# Patient Record
Sex: Male | Born: 1970 | Race: White | Hispanic: No | Marital: Married | Smoking: Former smoker
Health system: Southern US, Community
[De-identification: ages and names within clinical notes are randomized; demographics above are authoritative.]

## PROBLEM LIST (undated history)

## (undated) DIAGNOSIS — E7800 Pure hypercholesterolemia, unspecified: Secondary | ICD-10-CM

## (undated) DIAGNOSIS — K429 Umbilical hernia without obstruction or gangrene: Secondary | ICD-10-CM

## (undated) DIAGNOSIS — D171 Benign lipomatous neoplasm of skin and subcutaneous tissue of trunk: Secondary | ICD-10-CM

## (undated) DIAGNOSIS — R7989 Other specified abnormal findings of blood chemistry: Secondary | ICD-10-CM

## (undated) DIAGNOSIS — Z9889 Other specified postprocedural states: Secondary | ICD-10-CM

## (undated) DIAGNOSIS — K219 Gastro-esophageal reflux disease without esophagitis: Secondary | ICD-10-CM

## (undated) DIAGNOSIS — R112 Nausea with vomiting, unspecified: Secondary | ICD-10-CM

## (undated) DIAGNOSIS — G4730 Sleep apnea, unspecified: Secondary | ICD-10-CM

## (undated) DIAGNOSIS — I10 Essential (primary) hypertension: Secondary | ICD-10-CM

## (undated) HISTORY — PX: LIPOMA EXCISION: SHX5283

---

## 2006-07-21 ENCOUNTER — Ambulatory Visit: Payer: Self-pay | Admitting: Family Medicine

## 2007-05-10 ENCOUNTER — Emergency Department: Payer: Self-pay | Admitting: Emergency Medicine

## 2007-07-25 ENCOUNTER — Ambulatory Visit: Payer: Self-pay | Admitting: Family Medicine

## 2008-05-28 ENCOUNTER — Ambulatory Visit: Payer: Self-pay | Admitting: Internal Medicine

## 2008-07-01 ENCOUNTER — Ambulatory Visit: Payer: Self-pay | Admitting: Family Medicine

## 2010-05-04 ENCOUNTER — Ambulatory Visit: Payer: Self-pay | Admitting: Surgery

## 2010-05-27 ENCOUNTER — Ambulatory Visit: Payer: Self-pay | Admitting: Surgery

## 2010-06-18 ENCOUNTER — Ambulatory Visit: Payer: Self-pay | Admitting: Surgery

## 2011-02-10 DIAGNOSIS — I10 Essential (primary) hypertension: Secondary | ICD-10-CM | POA: Insufficient documentation

## 2011-02-10 HISTORY — DX: Essential (primary) hypertension: I10

## 2011-03-14 DIAGNOSIS — J309 Allergic rhinitis, unspecified: Secondary | ICD-10-CM | POA: Insufficient documentation

## 2011-03-14 HISTORY — DX: Allergic rhinitis, unspecified: J30.9

## 2011-12-09 DIAGNOSIS — M549 Dorsalgia, unspecified: Secondary | ICD-10-CM | POA: Insufficient documentation

## 2011-12-09 HISTORY — DX: Dorsalgia, unspecified: M54.9

## 2012-03-10 ENCOUNTER — Ambulatory Visit: Payer: Self-pay | Admitting: Medical

## 2012-03-16 ENCOUNTER — Ambulatory Visit: Payer: Self-pay | Admitting: Medical

## 2012-10-30 ENCOUNTER — Ambulatory Visit: Payer: Self-pay | Admitting: Family Medicine

## 2014-09-10 IMAGING — CT CT STONE STUDY
1 of 2 series · 15 of 32 positions shown, 19 images · non-contrast
Comparison: none

REASON FOR EXAM: flank pain  eval for kidney stones
COMMENTS:

[Series 2: soft tissue · axial · 0.78mm/px · z∈[-812,-350]mm · 15 of 169 slices shown, 19 images]
[im 8/169  soft-tissue]
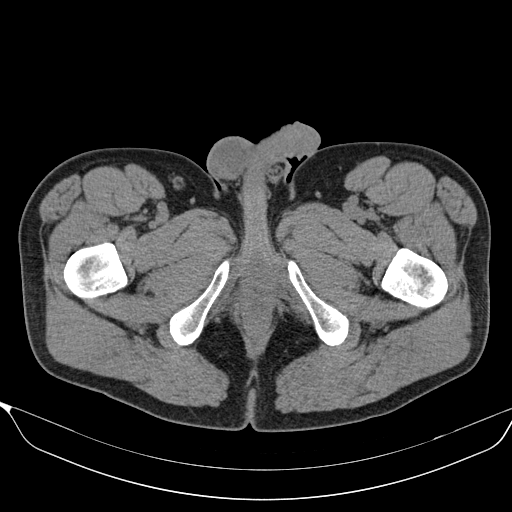
[im 8/169  bone]
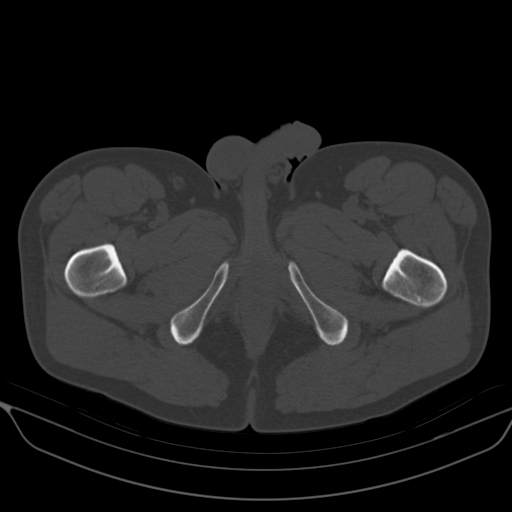
[im 22/169  soft-tissue]
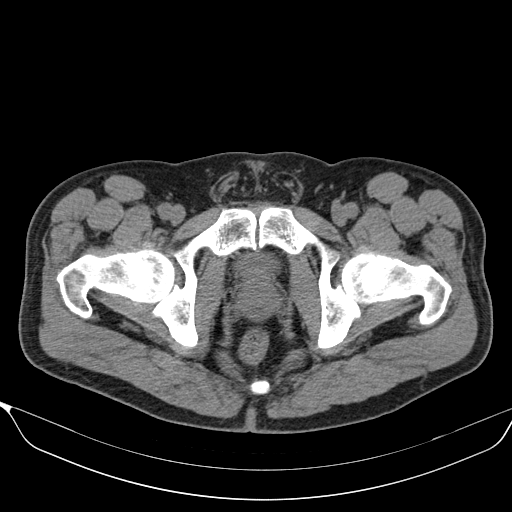
[im 36/169  soft-tissue]
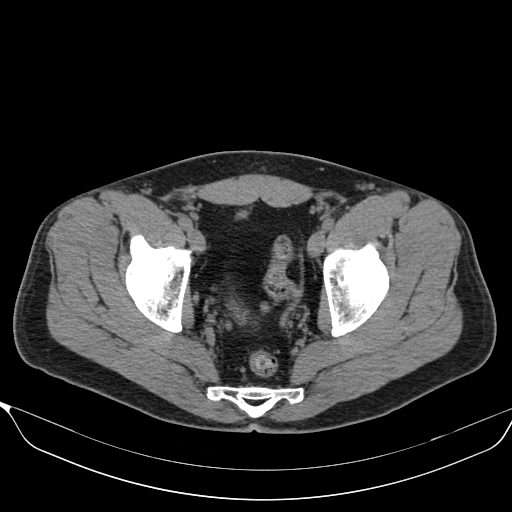
[im 50/169  soft-tissue]
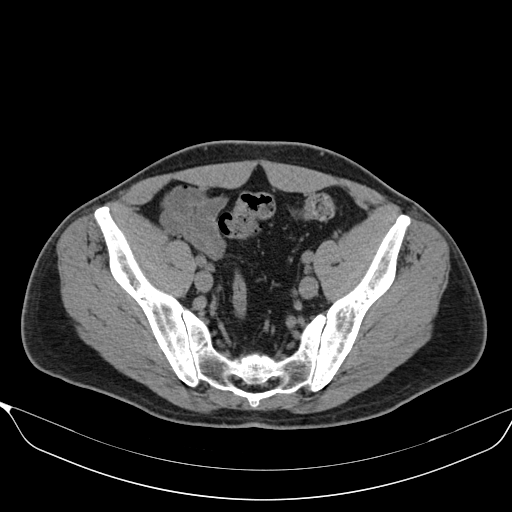
[im 57/169  soft-tissue]
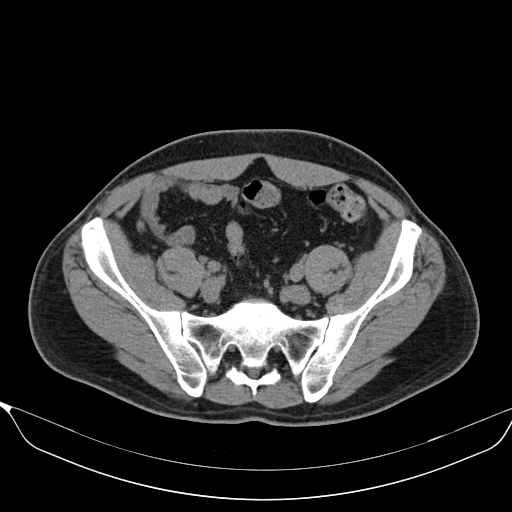
[im 71/169  soft-tissue]
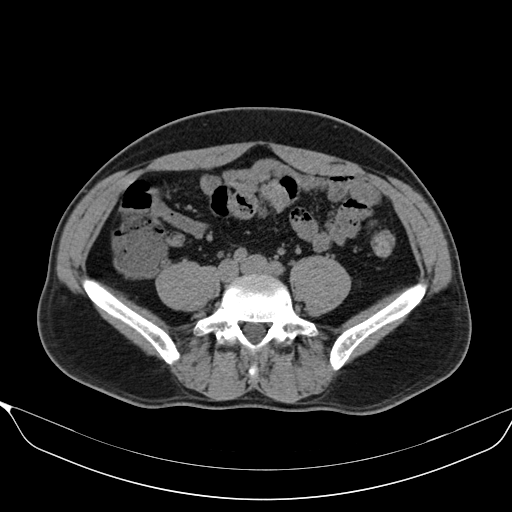
[im 85/169  soft-tissue]
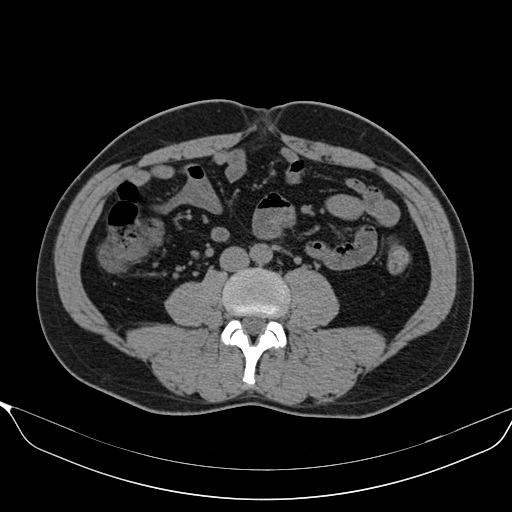
[im 99/169  soft-tissue]
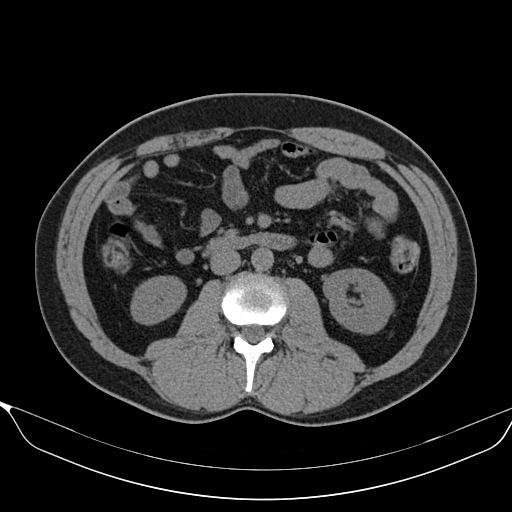
[im 113/169  soft-tissue]
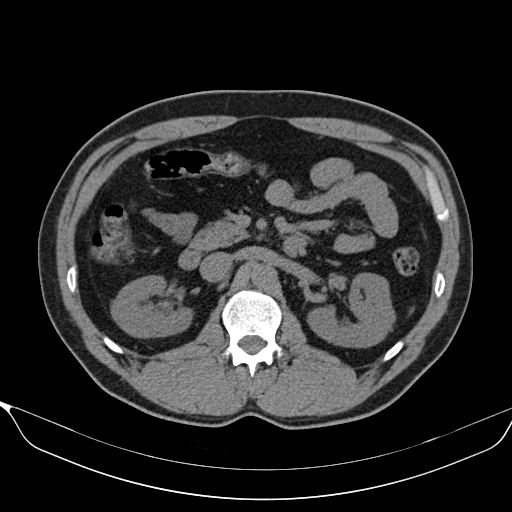
[im 113/169  bone]
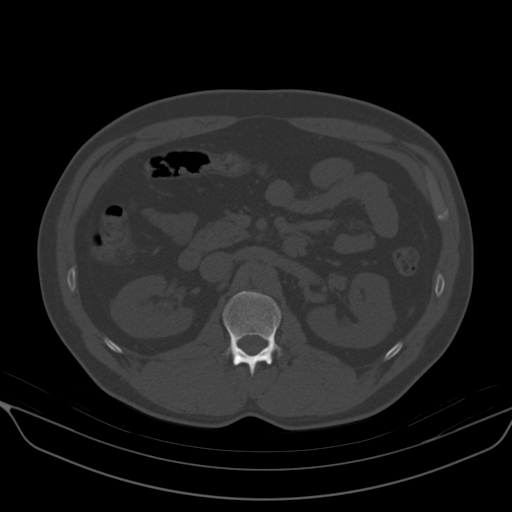
[im 120/169  soft-tissue]
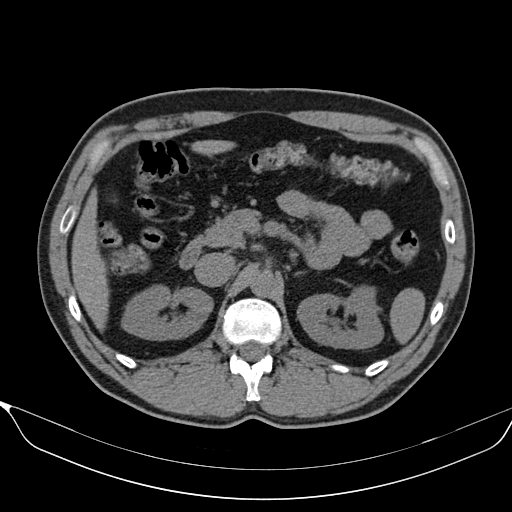
[im 134/169  soft-tissue]
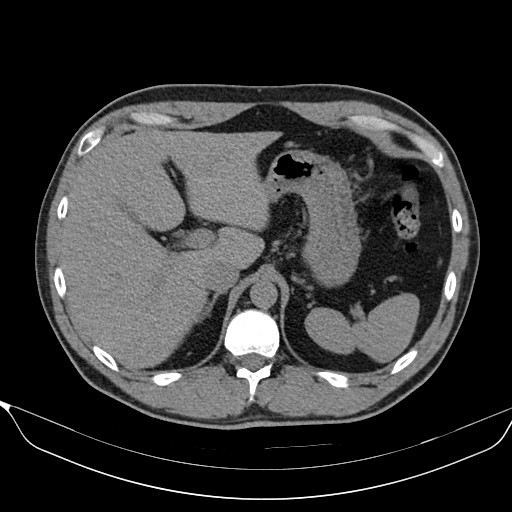
[im 141/169  lung]
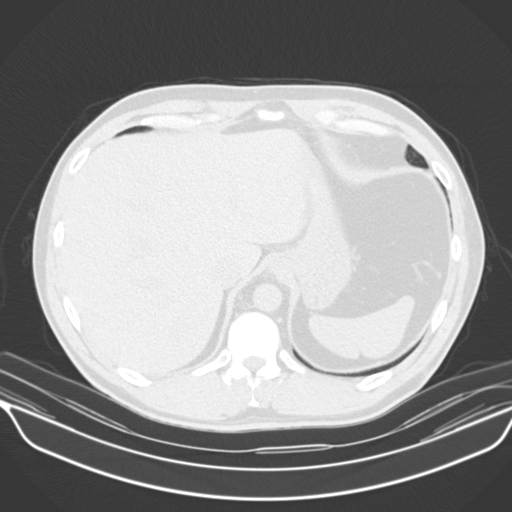
[im 148/169  soft-tissue]
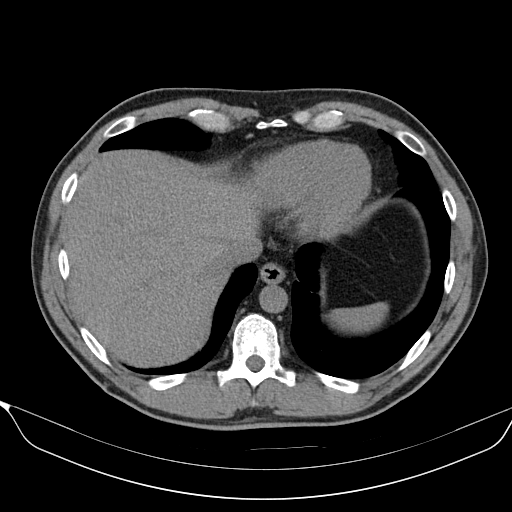
[im 148/169  lung]
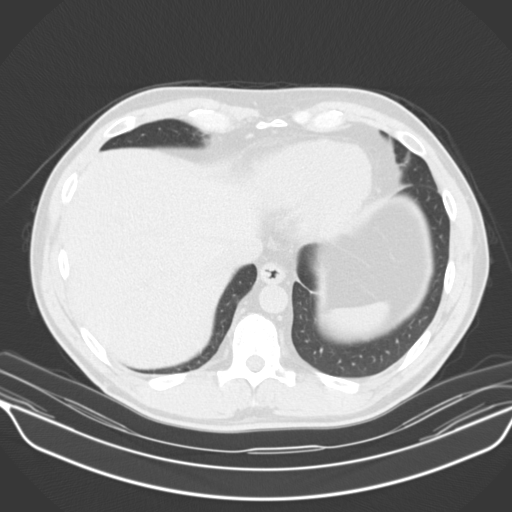
[im 155/169  lung]
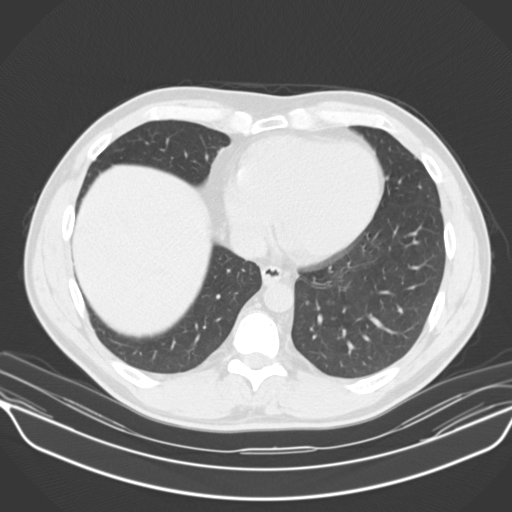
[im 162/169  soft-tissue]
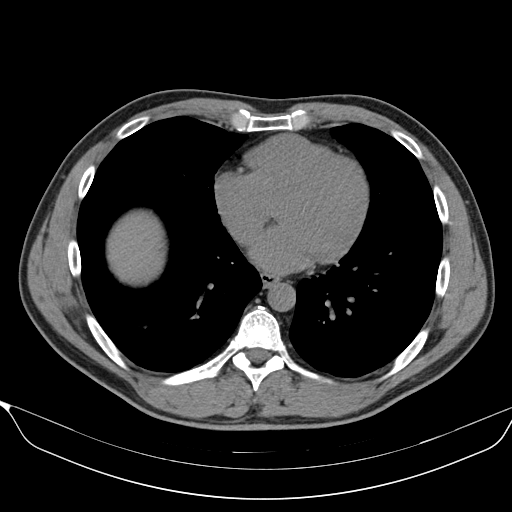
[im 162/169  lung]
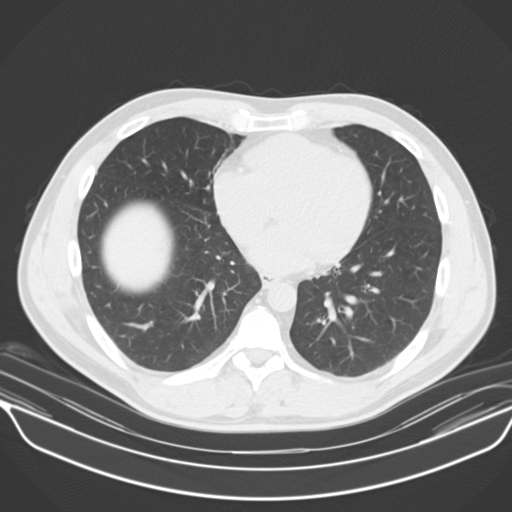

[15 of 32 positions shown; findings below may reference images not displayed]

PROCEDURE:     SHUMAILA - SHUMAILA ABDOMEN/PELVIS WO ( STONE)  - October 30, 2012 [DATE]

RESULT:     Noncontrast CT of the abdomen and pelvis is reconstructed at 3
mm slice thickness in the axial plane. The patient has no previous exam for
comparison.

There is no evidence of nephrolithiasis or hydronephrosis. No hydroureter is
evident. No ureteral stones are evident. No bladder calculi are
demonstrated. The appendix is seen and appears normal. The aorta is normal
in caliber. There is no abnormal bowel distention or wall thickening
evident. The noncontrast images through the base the lungs then straight
minimal nodularity laterally in the left lower lobe on image 5 measuring 2
mm. The this is noncalcified. No other pulmonary nodules are seen. There is
no pleural or pericardial effusion. The heart is normal in size. The
noncontrast images show grossly normal appearance of the liver, spleen,
pancreas, adrenal glands and bowel. No adenopathy is evident. The bony
structures appear within normal limits. There is no evidence of a renal mass.
IMPRESSION: 1. No nephrolithiasis, hydronephrosis or renal mass evident. No radiopaque
gallstones are evident. The abdominal viscera otherwise appear grossly
normal for noncontrast exam. The appendix is seen and appears unremarkable.
The abdominal wall is intact.

[REDACTED]

## 2014-09-18 ENCOUNTER — Ambulatory Visit: Payer: Self-pay | Admitting: Emergency Medicine

## 2015-07-09 ENCOUNTER — Ambulatory Visit
Admission: EM | Admit: 2015-07-09 | Discharge: 2015-07-09 | Disposition: A | Discharge status: Home / Self Care | Payer: Managed Care, Other (non HMO) | Attending: Family Medicine | Admitting: Family Medicine

## 2015-07-09 DIAGNOSIS — H6981 Other specified disorders of Eustachian tube, right ear: Secondary | ICD-10-CM

## 2015-07-09 DIAGNOSIS — J0100 Acute maxillary sinusitis, unspecified: Secondary | ICD-10-CM

## 2015-07-09 HISTORY — DX: Essential (primary) hypertension: I10

## 2015-07-09 HISTORY — DX: Pure hypercholesterolemia, unspecified: E78.00

## 2015-07-09 MED ORDER — AMOXICILLIN-POT CLAVULANATE 875-125 MG PO TABS: 1.0000 | ORAL_TABLET | Freq: Two times a day (BID) | ORAL | Status: DC

## 2015-07-09 MED ORDER — LORATADINE-PSEUDOEPHEDRINE ER 5-120 MG PO TB12: 1.0000 | ORAL_TABLET | Freq: Two times a day (BID) | ORAL | Status: AC

## 2015-07-09 NOTE — ED Provider Notes (Signed)
[facility]  ____________________________________________  Time seen: Approximately 10:38 AM  I have reviewed the triage vital signs and the nursing notes.   HISTORY  Chief Complaint URI and Otalgia   HPI Jay Trujillo is a 44 y.o. male presents for complaint of 7 days of runny nose, nasal congestion, sinus drainage and right ear discomfort. Patient reports in the last 3 days right ear discomfort has increased. Patient has right ear discomfort as 5 out of 10 aching and throbbing and states feels clogged. Denies hearing changes. Denies fall or trauma. Denies drainage from ear.  Patient does report intermittent thick greenish nasal drainage. Reports intermittent cough but states that that has a nonproductive dry cough. Reports continues to eat and drink well. Denies fevers. Reports several of his children recently with similar.  Denies chest pain, shortness breath, abdominal pain, dizziness, neck or back pain. Denies recent sickness. Denies recent antibiotic use. Patient reports he has not yet taken his daily hypertensive medication yet, but will take once returning home.    Past Medical History  Diagnosis Date  . Hypertension   . Hypercholesteremia     There are no active problems to display for this patient.   History reviewed. No pertinent past surgical history.  Current Outpatient Rx  Name  Route  Sig  Dispense  Refill  . lisinopril (PRINIVIL,ZESTRIL) 20 MG tablet   Oral   Take 20 mg by mouth daily.         . rosuvastatin (CRESTOR) 20 MG tablet   Oral   Take 20 mg by mouth daily.           Allergies Review of patient's allergies indicates no known allergies.  Family History  Problem Relation Age of Onset  . Diabetes Mother     Social History Social History  Substance Use Topics  . Smoking status: Former Research scientist (life sciences)  . Smokeless tobacco: None  . Alcohol Use: Yes     Comment: socially   Family history Father: CAD, MI   Review of  Systems Constitutional: No fever/chills Eyes: No visual changes. ENT: No sore throat.positive for runny nose, nasal congestion, right ear discomfort.  Cardiovascular: Denies chest pain. Respiratory: Denies shortness of breath. Gastrointestinal: No abdominal pain.  No nausea, no vomiting.  No diarrhea.  No constipation. Genitourinary: Negative for dysuria. Musculoskeletal: Negative for back pain. Skin: Negative for rash. Neurological: Negative for headaches, focal weakness or numbness.  10-point ROS otherwise negative.  ____________________________________________   PHYSICAL EXAM:  VITAL SIGNS: ED Triage Vitals  Enc Vitals Group     BP 07/09/15 1026 135/89 mmHg     Pulse Rate 07/09/15 1026 83     Resp 07/09/15 1026 17     Temp 07/09/15 1026 97.3 F (36.3 C)     Temp Source 07/09/15 1026 Tympanic     SpO2 07/09/15 1026 98 %     Weight 07/09/15 1026 198 lb (89.812 kg)     Height 07/09/15 1026 6' (1.829 m)     Head Cir --      Peak Flow --      Pain Score 07/09/15 1030 3     Pain Loc --      Pain Edu? --      Excl. in [place]? --     Constitutional: Alert and oriented. Well appearing and in no acute distress. Eyes: Conjunctivae are normal. PERRL. EOMI. Head: Atraumatic. Mild to moderate tenderness to palpation bilateral maxillary as well as frontal sinus tenderness to  palpation, no swelling. No erythema.   Ears: no erythema, normal TMs bilaterally.   Nose: Nasal congestion. Bilateral nasal turbinate erythema and edema.   Mouth/Throat: Mucous membranes are moist.  Mild pharyngeal erythema. No tonsillar swelling or exudate. No uvular shift or deviation. Neck: No stridor.  No cervical spine tenderness to palpation. Hematological/Lymphatic/Immunilogical: No cervical lymphadenopathy. Cardiovascular: Normal rate, regular rhythm. Grossly normal heart sounds.  Good peripheral circulation. Respiratory: Normal respiratory effort.  No retractions. Lungs CTAB. No wheeze, rales or rhonchi.  Good air movement. Gastrointestinal: Soft and nontender.  Musculoskeletal: No lower or upper extremity tenderness nor edema.  Bilateral pedal pulses equal and easily palpated.  Neurologic:  Normal speech and language. No gross focal neurologic deficits are appreciated. No gait instability. Skin:  Skin is warm, dry and intact. No rash noted. Psychiatric: Mood and affect are normal. Speech and behavior are normal. ____________________________________________   LABS (all labs ordered are listed, but only abnormal results are displayed)  Labs Reviewed - No data to display   INITIAL IMPRESSION / ASSESSMENT AND PLAN / ED COURSE  Pertinent labs & imaging results that were available during my care of the patient were reviewed by me and considered in my medical decision making (see chart for details).  Very well-appearing patient. No acute distress. Presents for complaints of 1 week in the upper respiratory infection symptoms as well as sinus drainage and right ear discomfort. Reports continues to eat and drink well. Afebrile. Vital signs stable. Reports his complaints of pain unrelieved with over-the-counter Mucinex. Lungs clear throughout. Abdomen soft and nontender. Moist membranes. Very well-appearing patient. Suspect frontal and maxillary sinusitis as well as right eustachian tube dysfunction. Will treat patient with oral Augmentin as well as Claritin-D. Encouraged patient to rest, fluids, over-the-counter Tylenol ibuprofen as needed.  Follow up with Primary care physician this week. Discussed follow up and return parameters including no resolution or any worsening concerns. Patient verbalized understanding and agreed to plan.   ____________________________________________   FINAL CLINICAL IMPRESSION(S) / ED DIAGNOSES  Final diagnoses:  None       Marylene Land, NP 07/09/15 1246

## 2015-07-09 NOTE — ED Notes (Signed)
States started 1 week ago with pain below right ear. For the last 3 days had cough and worsening ear and jaw pain

## 2015-07-09 NOTE — Discharge Instructions (Signed)
Take medication as prescribed. Rest. Drink plenty of fluids.   Follow up with your primary care physician this week as need. Return to Urgent care as needed for new or worsening concerns.   Sinusitis, Adult Sinusitis is redness, soreness, and inflammation of the paranasal sinuses. Paranasal sinuses are air pockets within the bones of your face. They are located beneath your eyes, in the middle of your forehead, and above your eyes. In healthy paranasal sinuses, mucus is able to drain out, and air is able to circulate through them by way of your nose. However, when your paranasal sinuses are inflamed, mucus and air can become trapped. This can allow bacteria and other germs to grow and cause infection. Sinusitis can develop quickly and last only a short time (acute) or continue over a long period (chronic). Sinusitis that lasts for more than 12 weeks is considered chronic. CAUSES Causes of sinusitis include:  Allergies.  Structural abnormalities, such as displacement of the cartilage that separates your nostrils (deviated septum), which can decrease the air flow through your nose and sinuses and affect sinus drainage.  Functional abnormalities, such as when the small hairs (cilia) that line your sinuses and help remove mucus do not work properly or are not present. SIGNS AND SYMPTOMS Symptoms of acute and chronic sinusitis are the same. The primary symptoms are pain and pressure around the affected sinuses. Other symptoms include:  Upper toothache.  Earache.  Headache.  Bad breath.  Decreased sense of smell and taste.  A cough, which worsens when you are lying flat.  Fatigue.  Fever.  Thick drainage from your nose, which often is green and may contain pus (purulent).  Swelling and warmth over the affected sinuses. DIAGNOSIS Your health care provider will perform a physical exam. During your exam, your health care provider may perform any of the following to help determine if you  have acute sinusitis or chronic sinusitis:  Look in your nose for signs of abnormal growths in your nostrils (nasal polyps).  Tap over the affected sinus to check for signs of infection.  View the inside of your sinuses using an imaging device that has a light attached (endoscope). If your health care provider suspects that you have chronic sinusitis, one or more of the following tests may be recommended:  Allergy tests.  Nasal culture. A sample of mucus is taken from your nose, sent to a lab, and screened for bacteria.  Nasal cytology. A sample of mucus is taken from your nose and examined by your health care provider to determine if your sinusitis is related to an allergy. TREATMENT Most cases of acute sinusitis are related to a viral infection and will resolve on their own within 10 days. Sometimes, medicines are prescribed to help relieve symptoms of both acute and chronic sinusitis. These may include pain medicines, decongestants, nasal steroid sprays, or saline sprays. However, for sinusitis related to a bacterial infection, your health care provider will prescribe antibiotic medicines. These are medicines that will help kill the bacteria causing the infection. Rarely, sinusitis is caused by a fungal infection. In these cases, your health care provider will prescribe antifungal medicine. For some cases of chronic sinusitis, surgery is needed. Generally, these are cases in which sinusitis recurs more than 3 times per year, despite other treatments. HOME CARE INSTRUCTIONS  Drink plenty of water. Water helps thin the mucus so your sinuses can drain more easily.  Use a humidifier.  Inhale steam 3-4 times a day (for example, sit  in the bathroom with the shower running).  Apply a warm, moist washcloth to your face 3-4 times a day, or as directed by your health care provider.  Use saline nasal sprays to help moisten and clean your sinuses.  Take medicines only as directed by your health  care provider.  If you were prescribed either an antibiotic or antifungal medicine, finish it all even if you start to feel better. SEEK IMMEDIATE MEDICAL CARE IF:  You have increasing pain or severe headaches.  You have nausea, vomiting, or drowsiness.  You have swelling around your face.  You have vision problems.  You have a stiff neck.  You have difficulty breathing.   This information is not intended to replace advice given to you by your health care provider. Make sure you discuss any questions you have with your health care provider.   Document Released: 07/04/2005 Document Revised: 07/25/2014 Document Reviewed: 07/19/2011 Elsevier Interactive Patient Education 2016 Elsevier Inc.  Sinus Rinse WHAT IS A SINUS RINSE? A sinus rinse is a simple home treatment that is used to rinse your sinuses with a sterile mixture of salt and water (saline solution). Sinuses are air-filled spaces in your skull behind the bones of your face and forehead that open into your nasal cavity. You will use the following:  Saline solution.  Neti pot or spray bottle. This releases the saline solution into your nose and through your sinuses. Neti pots and spray bottles can be purchased at [employer], a health food store, or online. WHEN WOULD I DO A SINUS RINSE? A sinus rinse can help to clear mucus, dirt, dust, or pollen from the nasal cavity. You may do a sinus rinse when you have a cold, a virus, nasal allergy symptoms, a sinus infection, or stuffiness in the nose or sinuses. If you are considering a sinus rinse:  Ask your child's health care provider before performing a sinus rinse on your child.  Do not do a sinus rinse if you have had ear or nasal surgery, ear infection, or blocked ears. HOW DO I DO A SINUS RINSE?  Wash your hands.  Disinfect your device according to the directions provided and then dry it.  Use the solution that comes with your device or one that is sold  separately in stores. Follow the mixing directions on the package.  Fill your device with the amount of saline solution as directed by the device instructions.  Stand over a sink and tilt your head sideways over the sink.  Place the spout of the device in your upper nostril (the one closer to the ceiling).  Gently pour or squeeze the saline solution into the nasal cavity. The liquid should drain to the lower nostril if you are not overly congested.  Gently blow your nose. Blowing too hard may cause ear pain.  Repeat in the other nostril.  Clean and rinse your device with clean water and then air-dry it. ARE THERE RISKS OF A SINUS RINSE?  Sinus rinse is generally very safe and effective. However, there are a few risks, which include:   A burning sensation in the sinuses. This may happen if you do not make the saline solution as directed. Make sure to follow all directions when making the saline solution.  Infection from contaminated water. This is rare, but possible.  Nasal irritation.   This information is not intended to replace advice given to you by your health care provider. Make sure you discuss any questions you  have with your health care provider.   Document Released: 01/29/2014 Document Reviewed: 01/29/2014 Elsevier Interactive Patient Education [employer].

## 2016-09-03 ENCOUNTER — Ambulatory Visit
Admission: EM | Admit: 2016-09-03 | Discharge: 2016-09-03 | Disposition: A | Discharge status: Home / Self Care | Payer: Managed Care, Other (non HMO) | Attending: Family Medicine | Admitting: Family Medicine

## 2016-09-03 DIAGNOSIS — R05 Cough: Secondary | ICD-10-CM

## 2016-09-03 DIAGNOSIS — H6503 Acute serous otitis media, bilateral: Secondary | ICD-10-CM | POA: Diagnosis not present

## 2016-09-03 DIAGNOSIS — R059 Cough, unspecified: Secondary | ICD-10-CM

## 2016-09-03 MED ORDER — AZITHROMYCIN 250 MG PO TABS: ORAL_TABLET | ORAL | 0 refills | Status: DC

## 2016-09-03 MED ORDER — HYDROCOD POLST-CPM POLST ER 10-8 MG/5ML PO SUER: 5.0000 mL | Freq: Two times a day (BID) | ORAL | 0 refills | Status: DC | PRN

## 2016-09-03 NOTE — ED Triage Notes (Signed)
Patient complains of cough and congestion x 2-3 weeks. Patient states that last night he had some bad indigestion. Patient reports that he has coughed so hard that he gets dizzy.

## 2016-09-03 NOTE — ED Provider Notes (Signed)
[facility]    CSN: [id number] Arrival date & time: 09/03/16  [id number]     History   Chief Complaint Chief Complaint  Patient presents with  . Cough    HPI Jay Trujillo is a 46 y.o. male.   The history is provided by the patient.  Cough  URI  Presenting symptoms: congestion and cough   Severity:  Moderate Onset quality:  Sudden Duration:  2 weeks Timing:  Constant Progression:  Worsening Chronicity:  New Relieved by:  Nothing Ineffective treatments:  OTC medications Risk factors: sick contacts   Risk factors: not elderly, no chronic cardiac disease, no chronic kidney disease, no chronic respiratory disease, no diabetes mellitus, no immunosuppression, no recent illness and no recent travel     Past Medical History:  Diagnosis Date  . Hypercholesteremia   . Hypertension     There are no active problems to display for this patient.   History reviewed. No pertinent surgical history.     Home Medications    Prior to Admission medications   Medication Sig Start Date End Date Taking? Authorizing Provider  lisinopril (PRINIVIL,ZESTRIL) 20 MG tablet Take 20 mg by mouth daily.   Yes Historical Provider, MD  rosuvastatin (CRESTOR) 20 MG tablet Take 20 mg by mouth daily.   Yes Historical Provider, MD  amoxicillin-clavulanate (AUGMENTIN) 875-125 MG tablet Take 1 tablet by mouth every 12 (twelve) hours. 07/09/15   Marylene Land, NP  azithromycin (ZITHROMAX Z-PAK) 250 MG tablet 2 tabs po once day 1, then 1 tab po qd for next 4 days 09/03/16   Norval Gable, MD  chlorpheniramine-HYDROcodone [facility] ER) 10-8 MG/5ML SUER Take 5 mLs by mouth every 12 (twelve) hours as needed. 09/03/16   Norval Gable, MD    Family History Family History  Problem Relation Age of Onset  . Diabetes Mother     Social History Social History  Substance Use Topics  . Smoking status: Former Research scientist (life sciences)  . Smokeless tobacco: Never Used  . Alcohol use Yes   Comment: socially     Allergies   Patient has no known allergies.   Review of Systems Review of Systems  HENT: Positive for congestion.   Respiratory: Positive for cough.      Physical Exam Triage Vital Signs ED Triage Vitals  Enc Vitals Group     BP 09/03/16 1007 (!) 136/94     Pulse Rate 09/03/16 1007 81     Resp 09/03/16 1007 18     Temp 09/03/16 1007 98.2 F (36.8 C)     Temp Source 09/03/16 1007 Oral     SpO2 09/03/16 1007 97 %     Weight 09/03/16 1005 198 lb (89.8 kg)     Height 09/03/16 1005 6' (1.829 m)     Head Circumference --      Peak Flow --      Pain Score 09/03/16 1005 0     Pain Loc --      Pain Edu? --      Excl. in [place]? --    No data found.   Updated Vital Signs BP (!) 136/94 (BP Location: Left Arm)   Pulse 81   Temp 98.2 F (36.8 C) (Oral)   Resp 18   Ht 6' (1.829 m)   Wt 198 lb (89.8 kg)   SpO2 97%   BMI 26.85 kg/m   Visual Acuity Right Eye Distance:   Left Eye Distance:   Bilateral Distance:  Right Eye Near:   Left Eye Near:    Bilateral Near:     Physical Exam  Constitutional: He appears well-developed and well-nourished. No distress.  HENT:  Head: Normocephalic and atraumatic.  Right Ear: External ear and ear canal normal. Tympanic membrane is injected and bulging. A middle ear effusion is present.  Left Ear: External ear and ear canal normal. Tympanic membrane is injected and bulging. A middle ear effusion is present.  Nose: Nose normal.  Mouth/Throat: Uvula is midline, oropharynx is clear and moist and mucous membranes are normal. No oropharyngeal exudate or tonsillar abscesses.  Eyes: Conjunctivae and EOM are normal. Pupils are equal, round, and reactive to light. Right eye exhibits no discharge. Left eye exhibits no discharge. No scleral icterus.  Neck: Normal range of motion. Neck supple. No tracheal deviation present. No thyromegaly present.  Cardiovascular: Normal rate, regular rhythm and normal heart sounds.     Pulmonary/Chest: Effort normal and breath sounds normal. No stridor. No respiratory distress. He has no wheezes. He has no rales. He exhibits no tenderness.  Lymphadenopathy:    He has no cervical adenopathy.  Neurological: He is alert.  Skin: Skin is warm and dry. No rash noted. He is not diaphoretic.  Nursing note and vitals reviewed.    UC Treatments / Results  Labs (all labs ordered are listed, but only abnormal results are displayed) Labs Reviewed - No data to display  EKG  EKG Interpretation None       Radiology No results found.  Procedures Procedures (including critical care time)  Medications Ordered in UC Medications - No data to display   Initial Impression / Assessment and Plan / UC Course  I have reviewed the triage vital signs and the nursing notes.  Pertinent labs & imaging results that were available during my care of the patient were reviewed by me and considered in my medical decision making (see chart for details).       Final Clinical Impressions(s) / UC Diagnoses   Final diagnoses:  Bilateral acute serous otitis media, recurrence not specified  Cough    New Prescriptions Discharge Medication List as of 09/03/2016 10:59 AM    START taking these medications   Details  azithromycin (ZITHROMAX Z-PAK) 250 MG tablet 2 tabs po once day 1, then 1 tab po qd for next 4 days, Normal    chlorpheniramine-HYDROcodone (TUSSIONEX PENNKINETIC ER) 10-8 MG/5ML SUER Take 5 mLs by mouth every 12 (twelve) hours as needed., Starting Sat 09/03/2016, Normal       1. diagnosis reviewed with patient 2. rx as per orders above; reviewed possible side effects, interactions, risks and benefits  3. Rest, fluids  4. Follow-up prn if symptoms worsen or don't improve   Norval Gable, MD 09/03/16 1119

## 2016-11-19 ENCOUNTER — Ambulatory Visit
Admission: EM | Admit: 2016-11-19 | Discharge: 2016-11-19 | Disposition: A | Discharge status: Home / Self Care | Payer: Worker's Compensation | Attending: Family Medicine | Admitting: Family Medicine

## 2016-11-19 ENCOUNTER — Encounter: Payer: Self-pay | Admitting: *Deleted

## 2016-11-19 DIAGNOSIS — M7711 Lateral epicondylitis, right elbow: Secondary | ICD-10-CM

## 2016-11-19 MED ORDER — DICLOFENAC SODIUM 75 MG PO TBEC: 75.0000 mg | DELAYED_RELEASE_TABLET | Freq: Two times a day (BID) | ORAL | 0 refills | Status: DC

## 2016-11-19 NOTE — Discharge Instructions (Signed)
Medication as prescribed.  Exercises will help as well.  Take care  Dr. Lacinda Axon

## 2016-11-19 NOTE — ED Provider Notes (Signed)
[facility]    CSN: [id number] Arrival date & time: 11/19/16  0844  History   Chief Complaint Chief Complaint  Patient presents with  . Elbow Pain   HPI  46 year old male presents with complaints of elbow pain. Workmen's Comp.  Patient reports that on 4/12 he was at his job. He was fixing a piece of equipment and was pulling it apart with his right hand. He states that it required a lot of force as it was "bound up". As a result of his grabbing and pulling he injured his right elbow. He states that he saw his primary care provider and they advised supportive care. He states that his pain has continued to persist. He reports that it's worse with grabbing objects/lifting. Pain is located at the lateral epicondyle. He's taken some ibuprofen with some improvement. He has no other associated symptoms. No other complaints or concerns at this time.  Past Medical History:  Diagnosis Date  . Hypercholesteremia   . Hypertension    There are no active problems to display for this patient.  History reviewed. No pertinent surgical history.   Home Medications    Prior to Admission medications   Medication Sig Start Date End Date Taking? Authorizing Provider  lisinopril (PRINIVIL,ZESTRIL) 20 MG tablet Take 20 mg by mouth daily.   Yes [provider]  rosuvastatin (CRESTOR) 20 MG tablet Take 20 mg by mouth daily.   Yes [provider]  diclofenac (VOLTAREN) 75 MG EC tablet Take 1 tablet (75 mg total) by mouth 2 (two) times daily. 11/19/16   Coral Spikes, DO   Family History Family History  Problem Relation Age of Onset  . Diabetes Mother    Social History Social History  Substance Use Topics  . Smoking status: Former Research scientist (life sciences)  . Smokeless tobacco: Never Used  . Alcohol use Yes     Comment: socially   Allergies   Patient has no known allergies.  Review of Systems Review of Systems  Constitutional: Negative.   Musculoskeletal:       Elbow pain (left).       Physical Exam Triage Vital Signs ED Triage Vitals  Enc Vitals Group     BP 11/19/16 0926 138/88     Pulse Rate 11/19/16 0926 80     Resp 11/19/16 0926 16     Temp 11/19/16 0926 98.5 F (36.9 C)     Temp src --      SpO2 11/19/16 0926 98 %     Weight 11/19/16 0928 196 lb (88.9 kg)     Height 11/19/16 0928 6' (1.829 m)     Head Circumference --      Peak Flow --      Pain Score --      Pain Loc --      Pain Edu? --      Excl. in [place]? --     Updated Vital Signs BP 138/88 (BP Location: Left Arm)   Pulse 80   Temp 98.5 F (36.9 C)   Resp 16   Ht 6' (1.829 m)   Wt 196 lb (88.9 kg)   SpO2 98%   BMI 26.58 kg/m   Physical Exam  Constitutional: He is oriented to person, place, and time. He appears well-developed. No distress.  Cardiovascular: Normal rate and regular rhythm.   Pulmonary/Chest: Effort normal and breath sounds normal.  Musculoskeletal:  Right elbow - tenderness to palpation of lateral epicondyle. Firing of the extensor muscles  reproduces pain.  Neurological: He is alert and oriented to person, place, and time.  Psychiatric: He has a normal mood and affect.  Vitals reviewed.  UC Treatments / Results  Labs (all labs ordered are listed, but only abnormal results are displayed) Labs Reviewed - No data to display  EKG  EKG Interpretation None       Radiology No results found.  Procedures Procedures (including critical care time)  Medications Ordered in UC Medications - No data to display   Initial Impression / Assessment and Plan / UC Course  I have reviewed the triage vital signs and the nursing notes.  Pertinent labs & imaging results that were available during my care of the patient were reviewed by me and considered in my medical decision making (see chart for details).    46 year old male presents with lateral epicondylitis. Treating with diclofenac. Advised bracing if he desires.  Final Clinical Impressions(s) / UC Diagnoses    Final diagnoses:  Lateral epicondylitis of right elbow   New Prescriptions Discharge Medication List as of 11/19/2016  9:51 AM    START taking these medications   Details  diclofenac (VOLTAREN) 75 MG EC tablet Take 1 tablet (75 mg total) by mouth 2 (two) times daily., Starting Sat 11/19/2016, Normal         Lacinda Axon [place], [place] 11/19/16 [phone]

## 2016-11-19 NOTE — ED Triage Notes (Signed)
While pulling on a "machine part" at work 3 weeks ago pt had right elbow pain which has persisted and worsened since.

## 2016-12-02 ENCOUNTER — Ambulatory Visit
Admission: EM | Admit: 2016-12-02 | Discharge: 2016-12-02 | Disposition: A | Discharge status: Home / Self Care | Payer: Worker's Compensation | Attending: Family Medicine | Admitting: Family Medicine

## 2016-12-02 ENCOUNTER — Encounter: Payer: Self-pay | Admitting: *Deleted

## 2016-12-02 ENCOUNTER — Ambulatory Visit (INDEPENDENT_AMBULATORY_CARE_PROVIDER_SITE_OTHER): Payer: Worker's Compensation

## 2016-12-02 DIAGNOSIS — M7711 Lateral epicondylitis, right elbow: Secondary | ICD-10-CM

## 2016-12-02 NOTE — ED Provider Notes (Signed)
CSN: [id number]     Arrival date & time 12/02/16  1025 History   First MD Initiated Contact with Patient 12/02/16 1042     Chief Complaint  Patient presents with  . Work Related Injury   (Consider location/radiation/quality/duration/timing/severity/associated sxs/prior Treatment) HPI  This a 46 year old male who has sustained a tennis elbow injury to his right dominant arm in a work related accident in April. At that time is placed on a mild restriction of not pushing or pulling or lifting greater than 10-15 pounds and placed on Mobic. 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 That time he was then switched to  Voltarin medication. Despite this protection as well as he could which he states is really impossible  At work he continues to have discomfort and returned here again. He still has a very tender common extensor origin.        Past Medical History:  Diagnosis Date  . Hypercholesteremia   . Hypertension    History reviewed. No pertinent surgical history. Family History  Problem Relation Age of Onset  . Diabetes Mother    Social History  Substance Use Topics  . Smoking status: Former Research scientist (life sciences)  . Smokeless tobacco: Never Used  . Alcohol use Yes     Comment: socially    Review of Systems  Constitutional: Positive for activity change. Negative for chills, fatigue and fever.  Musculoskeletal: Positive for myalgias.  All other systems reviewed and are negative.   Allergies  Patient has no known allergies.  Home Medications   Prior to Admission medications   Medication Sig Start Date End Date Taking? Authorizing Provider  diclofenac (VOLTAREN) 75 MG EC tablet Take 1 tablet (75 mg total) by mouth 2 (two) times daily. 11/19/16  Yes Cook, Jayce G, DO  lisinopril (PRINIVIL,ZESTRIL) 20 MG tablet Take 20 mg by mouth daily.   Yes [provider]  rosuvastatin (CRESTOR) 20 MG tablet Take 20 mg  by mouth daily.   Yes [provider]   Meds Ordered and Administered this Visit  Medications - No data to display  BP 126/82 (BP Location: Left Arm)   Pulse 71   Temp 97.8 F (36.6 C) (Oral)   Resp 16   SpO2 100%  No data found.   Physical Exam  Constitutional: He is oriented to person, place, and time. He appears well-developed and well-nourished. No distress.  HENT:  Head: Normocephalic.  Eyes: Pupils are equal, round, and reactive to light.  Neck: Normal range of motion.  Musculoskeletal: Normal range of motion. He exhibits tenderness.  Examination of the right elbow shows good range of motion flexion extension pronation supination. Patient has tenderness which is maximal over the common extensor origin which reproduces pain. Resisted wrist extension and lifting and grasp with the hand in pronation also reproduces pain.  Neurological: He is alert and oriented to person, place, and time.  Skin: Skin is warm and dry. He is not diaphoretic.  Psychiatric: He has a normal mood and affect. His behavior is normal. Judgment and thought content normal.  Nursing note and vitals reviewed.   Urgent Care Course     Procedures (including critical care time)  Labs Review Labs Reviewed - No data to display  Imaging Review Dg Elbow Complete Right  Result Date: 12/02/2016 CLINICAL DATA:  Right elbow pain following injury 1 month ago. Initial encounter. EXAM: RIGHT ELBOW - COMPLETE 3+ VIEW COMPARISON:  None. FINDINGS: There is no evidence of fracture, dislocation, or joint effusion. There is no evidence of arthropathy or other focal bone abnormality. Soft tissues are unremarkable. IMPRESSION: Negative. Electronically Signed   By: Margarette Canada M.D.   On: 12/02/2016 11:17     Visual Acuity Review  Right Eye Distance:   Left Eye Distance:   Bilateral Distance:    Right Eye Near:   Left Eye Near:    Bilateral Near:     Patient was given a Velcro wrist splint to be used  during active times    MDM   1. Right tennis elbow    I told patient that he has essentially failed conservative management and should be seen by an orthopedic surgeon for additional care as necessary. We will provide him with a Velcro wrist splint for work and active times. He may take this rsplint off during resting times. He'll continue with his Voltaren. Arrangements should be made by the workers comp carrier to secure him an appointment with an orthopedic surgeon for possible injection and further care. He should remain on his present restrictions until cleared by the orthopedic surgeon.    Lorin Picket, PA-C 12/02/16 1146

## 2016-12-02 NOTE — ED Triage Notes (Signed)
Patient has returned for [facility] follow up for right elbow injury.

## 2017-01-20 DIAGNOSIS — R229 Localized swelling, mass and lump, unspecified: Secondary | ICD-10-CM

## 2017-01-20 HISTORY — DX: Localized swelling, mass and lump, unspecified: R22.9

## 2017-07-25 DIAGNOSIS — F5104 Psychophysiologic insomnia: Secondary | ICD-10-CM | POA: Insufficient documentation

## 2017-07-25 HISTORY — DX: Psychophysiologic insomnia: F51.04

## 2017-09-08 DIAGNOSIS — G4733 Obstructive sleep apnea (adult) (pediatric): Secondary | ICD-10-CM

## 2017-09-08 HISTORY — DX: Obstructive sleep apnea (adult) (pediatric): G47.33

## 2018-05-01 ENCOUNTER — Other Ambulatory Visit: Payer: Self-pay | Admitting: Family Medicine

## 2018-05-01 DIAGNOSIS — R109 Unspecified abdominal pain: Secondary | ICD-10-CM

## 2019-02-28 DIAGNOSIS — E785 Hyperlipidemia, unspecified: Secondary | ICD-10-CM | POA: Insufficient documentation

## 2019-02-28 HISTORY — DX: Hyperlipidemia, unspecified: E78.5

## 2019-10-13 ENCOUNTER — Ambulatory Visit: Payer: Worker's Compensation | Attending: Internal Medicine

## 2019-10-13 DIAGNOSIS — Z23 Encounter for immunization: Secondary | ICD-10-CM

## 2019-10-13 NOTE — Progress Notes (Signed)
   Covid-19 Vaccination Clinic  Name:  Jay Trujillo    MRN: [id number] DOB: January 16, 1971  10/13/2019  Mr. Aronoff was observed post Covid-19 immunization for 15 minutes without incident. He was provided with Vaccine Information Sheet and instruction to access the V-Safe system.   Mr. Certo was instructed to call 911 with any severe reactions post vaccine: Marland Kitchen Difficulty breathing  . Swelling of face and throat  . A fast heartbeat  . A bad rash all over body  . Dizziness and weakness   Immunizations Administered    Name Date Dose VIS Date Route   Pfizer COVID-19 Vaccine 10/13/2019  3:51 PM 0.3 mL 06/28/2019 Intramuscular   Manufacturer: [place]   Lot: [id number]   [place]: [id number]

## 2019-11-08 ENCOUNTER — Other Ambulatory Visit: Payer: Self-pay

## 2019-11-08 ENCOUNTER — Ambulatory Visit: Payer: Worker's Compensation

## 2019-11-08 ENCOUNTER — Ambulatory Visit: Payer: 59 | Attending: Internal Medicine

## 2019-11-08 DIAGNOSIS — Z23 Encounter for immunization: Secondary | ICD-10-CM

## 2019-11-08 NOTE — Progress Notes (Signed)
   Covid-19 Vaccination Clinic  Name:  Jay Trujillo    MRN: [id number] DOB: 1971/02/03  11/08/2019  [name] was observed post Covid-19 immunization for 15 minutes without incident. He was provided with Vaccine Information Sheet and instruction to access the V-Safe system.   [name] was instructed to call 911 with any severe reactions post vaccine: Marland Kitchen Difficulty breathing  . Swelling of face and throat  . A fast heartbeat  . A bad rash all over body  . Dizziness and weakness   Immunizations Administered    Name Date Dose VIS Date Route   Pfizer COVID-19 Vaccine 11/08/2019  5:16 PM 0.3 mL 09/11/2018 Intramuscular   Manufacturer: [employer]   Lot: [id number]   [place]: [id number]

## 2021-07-18 HISTORY — PX: HERNIA REPAIR: SHX51

## 2022-08-09 ENCOUNTER — Ambulatory Visit: Payer: Self-pay | Admitting: Urology

## 2022-08-16 ENCOUNTER — Other Ambulatory Visit
Admission: RE | Admit: 2022-08-16 | Discharge: 2022-08-16 | Disposition: A | Discharge status: Home / Self Care | Payer: 59 | Attending: Urology | Admitting: Urology

## 2022-08-16 ENCOUNTER — Ambulatory Visit: Payer: 59 | Admitting: Urology

## 2022-08-16 ENCOUNTER — Encounter: Payer: Self-pay | Admitting: Urology

## 2022-08-16 ENCOUNTER — Other Ambulatory Visit: Payer: Self-pay

## 2022-08-16 VITALS — BP 129/78 | HR 80 | Ht 71.0 in | Wt 205.0 lb

## 2022-08-16 DIAGNOSIS — Z125 Encounter for screening for malignant neoplasm of prostate: Secondary | ICD-10-CM

## 2022-08-16 DIAGNOSIS — R350 Frequency of micturition: Secondary | ICD-10-CM

## 2022-08-16 DIAGNOSIS — E349 Endocrine disorder, unspecified: Secondary | ICD-10-CM

## 2022-08-16 LAB — URINALYSIS, COMPLETE (UACMP) WITH MICROSCOPIC
Bilirubin Urine: NEGATIVE
Glucose, UA: NEGATIVE mg/dL
Hgb urine dipstick: NEGATIVE
Ketones, ur: NEGATIVE mg/dL
Leukocytes,Ua: NEGATIVE
Nitrite: NEGATIVE
Protein, ur: NEGATIVE mg/dL
Specific Gravity, Urine: 1.02 (ref 1.005–1.030)
Squamous Epithelial / HPF: NONE SEEN /HPF (ref 0–5)
pH: 5.5 (ref 5.0–8.0)

## 2022-08-16 LAB — BLADDER SCAN AMB NON-IMAGING

## 2022-08-16 MED ORDER — CLOMID 50 MG PO TABS: 25.0000 mg | ORAL_TABLET | Freq: Every day | ORAL | 6 refills | Status: DC

## 2022-08-16 NOTE — Progress Notes (Signed)
   08/16/22 11:45 AM   Jay Trujillo Jay Trujillo May 07, 1971 [id number]  CC: Low testosterone, urinary symptoms  HPI: 52 year old male here to discuss the above issues.  He reportedly was seen at a men's clinic in [place] around 6 to 9 months ago and started on testosterone injections.  None of those labs are available to me.  Currently he is injecting 0.4 mL twice a week, as well as taking anastrozole.  I do not see that he has had PSAs or CBCs monitored.  He does feel improved from having the low testosterone which he thinks was originally in the 200s, however he is having some mood swings and his wife is concerned about some of his temper/anger issues.  He also had some trouble with urination with some intermittency a few weeks ago but that is since resolved.  He denies any other changes in diet or medications.  Denies any gross hematuria.  Prior PSA was normal at 1.06 in July 2022, which was prior to starting testosterone therapy.  He has multiple adult children, does not desire further biologic pregnancies.   PMH: Past Medical History:  Diagnosis Date   Hypercholesteremia    Hypertension      Family History: Family History  Problem Relation Age of Onset   Diabetes Mother     Social History:  reports that he has quit smoking. He has never been exposed to tobacco smoke. He has never used smokeless tobacco. He reports current alcohol use. He reports that he does not use drugs.  Physical Exam: BP 129/78   Pulse 80   Ht '5\' 11"'$  (1.803 m)   Wt 205 lb (93 kg)   BMI 28.59 kg/m    Constitutional:  Alert and oriented, No acute distress. Cardiovascular: No clubbing, cyanosis, or edema. Respiratory: Normal respiratory effort, no increased work of breathing. GI: Abdomen is soft, nontender, nondistended, no abdominal masses  Assessment & Plan:   52 year old male with multiple questions about testosterone replacement today.  We reviewed the AUA guidelines extensively, as well as other  treatment options including Clomid for testosterone replacement.  Workup and treatment options discussed, as well as need for monitoring blood work including PSA, CBC, potentially CMP if on Clomid.  He would like to get off the injections if possible secondary to his mood swings and short temper/anger issues.  Urinary symptoms have since resolved, we discussed considering cystoscopy or other investigation in the future if recurrent symptoms, we also discussed the relationship between testosterone and BPH, and some of his urinary symptoms could potentially be caused by significantly elevated testosterone levels.  Trial of Clomid 25 mg daily, can stop testosterone injections RTC 2 months with morning testosterone, PSA, CBC prior  Nickolas Madrid, MD 08/16/2022  [name] [address] [place], [place] [address] [phone]

## 2022-08-16 NOTE — Patient Instructions (Signed)
Prostate Cancer Screening  Prostate cancer screening is testing that is done to check for the presence of prostate cancer in men. The prostate gland is a walnut-sized gland that is located below the bladder and in front of the rectum in males. The function of the prostate is to add fluid to semen during ejaculation. Prostate cancer is one of the most common types of cancer in men. Who should have prostate cancer screening? Screening recommendations vary based on age and other risk factors, as well as between the professional organizations who make the recommendations. In general, screening is recommended if: You are age 50 to 70 and have an average risk for prostate cancer. You should talk with your health care provider about your need for screening and how often screening should be done. Because most prostate cancers are slow growing and will not cause death, screening in this age group is generally reserved for men who have a 10- to 15-year life expectancy. You are younger than age 50, and you have these risk factors: Having a father, brother, or uncle who has been diagnosed with prostate cancer. The risk is higher if your family member's cancer occurred at an early age or if you have multiple family members with prostate cancer at an early age. Being a male who is Black or is of Caribbean or sub-Saharan African descent. In general, screening is not recommended if: You are younger than age 40. You are between the ages of 40 and 49 and you have no risk factors. You are 70 years of age or older. At this age, the risks that screening can cause are greater than the benefits that it may provide. If you are at high risk for prostate cancer, your health care provider may recommend that you have screenings more often or that you start screening at a younger age. How is screening for prostate cancer done? The recommended prostate cancer screening test is a blood test called the prostate-specific antigen  (PSA) test. PSA is a protein that is made in the prostate. As you age, your prostate naturally produces more PSA. Abnormally high PSA levels may be caused by: Prostate cancer. An enlarged prostate that is not caused by cancer (benign prostatic hyperplasia, or BPH). This condition is very common in older men. A prostate gland infection (prostatitis) or urinary tract infection. Certain medicines such as male hormones (like testosterone) or other medicines that raise testosterone levels. A rectal exam may be done as part of prostate cancer screening to help provide information about the size of your prostate gland. When a rectal exam is performed, it should be done after the PSA level is drawn to avoid any effect on the results. Depending on the PSA results, you may need more tests, such as: A physical exam to check the size of your prostate gland, if not done as part of screening. Blood and imaging tests. A procedure to remove tissue samples from your prostate gland for testing (biopsy). This is the only way to know for certain if you have prostate cancer. What are the benefits of prostate cancer screening? Screening can help to identify cancer at an early stage, before symptoms start and when the cancer can be treated more easily. There is a small chance that screening may lower your risk of dying from prostate cancer. The chance is small because prostate cancer is a slow-growing cancer, and most men with prostate cancer die from a different cause. What are the risks of prostate cancer screening? The   main risk of prostate cancer screening is diagnosing and treating prostate cancer that would never have caused any symptoms or problems. This is called overdiagnosisand overtreatment. PSA screening cannot tell you if your PSA is high due to cancer or a different cause. A prostate biopsy is the only procedure to diagnose prostate cancer. Even the results of a biopsy may not tell you if your cancer needs to  be treated. Slow-growing prostate cancer may not need any treatment other than monitoring, so diagnosing and treating it may cause unnecessary stress or other side effects. Questions to ask your health care provider When should I start prostate cancer screening? What is my risk for prostate cancer? How often do I need screening? What type of screening tests do I need? How do I get my test results? What do my results mean? Do I need treatment? Where to find more information The American Cancer Society: www.cancer.org American Urological Association: www.auanet.org Contact a health care provider if: You have difficulty urinating. You have pain when you urinate or ejaculate. You have blood in your urine or semen. You have pain in your back or in the area of your prostate. Summary Prostate cancer is a common type of cancer in men. The prostate gland is located below the bladder and in front of the rectum. This gland adds fluid to semen during ejaculation. Prostate cancer screening may identify cancer at an early stage, when the cancer can be treated more easily and is less likely to have spread to other areas of the body. The prostate-specific antigen (PSA) test is the recommended screening test for prostate cancer, but it has associated risks. Discuss the risks and benefits of prostate cancer screening with your health care provider. If you are age 24 or older, the risks that screening can cause are greater than the benefits that it may provide. This information is not intended to replace advice given to you by your health care provider. Make sure you discuss any questions you have with your health care provider. Document Revised: 12/28/2020 Document Reviewed: 12/28/2020 Elsevier Patient Education  [place].  Hypogonadism, Male  Male hypogonadism is a condition of having a level of testosterone that is lower than normal. Testosterone is a chemical, or hormone, that is made mainly  in the testicles. In boys, testosterone is responsible for the development of male characteristics during puberty. These include: Making the penis bigger. Growing and building the muscles. Growing facial hair. Deepening the voice. In adult men, testosterone is responsible for maintaining: An interest in sex and the ability to have sex. Muscle mass. Sperm production. Red blood cell production. Bone strength. Testosterone also gives men energy and a sense of well-being. Testosterone normally decreases as men age and the testicles make less testosterone. Testosterone levels can vary from man to man. Not all men will have signs and symptoms of low testosterone. Weight, alcohol use, medicines, and certain medical conditions can affect a man's testosterone level. What are the causes? This condition is caused by: A natural decrease in testosterone that occurs as a man grows older. This is the main cause of this condition. Use of medicines, such as antidepressants, steroids, and opioids. Diseases and conditions that affect the testicles or the making of testosterone. These include: Injury or damage to the testicles from trauma, cancer, cancer treatment, or infection. Diabetes. Sleep apnea. Genetic conditions that men are born with. Disease of the pituitary gland. This gland is in the brain. It produces hormones. Obesity. Metabolic syndrome. This  is a group of diseases that affect blood pressure, blood sugar, cholesterol, and belly fat. HIV or AIDS. Alcohol abuse. Kidney failure. Other long-term or chronic diseases. What are the signs or symptoms? Common symptoms of this condition include: Loss of interest in sex (low sex drive). Inability to have or maintain an erection (erectile dysfunction). Feeling tired (fatigue). Mood changes, like irritability or depression. Loss of muscle and body hair. Infertility. Large breasts. Weight gain (obesity). How is this diagnosed? Your health care  provider can diagnose hypogonadism based on: Your signs and symptoms. A physical exam to check your testosterone levels. This includes blood tests. Testosterone levels can change throughout the day. Levels are highest in the morning. You may need to have repeat blood tests before getting a diagnosis of hypogonadism. Depending on your medical history and test results, your health care provider may also do other tests to find the cause of low testosterone. How is this treated? This condition is treated with testosterone replacement therapy. Testosterone can be given by: Injection or through pellets inserted under the skin. Gels or patches placed on the skin or in the mouth. Testosterone therapy is not for everyone. It has risks and side effects. Your health care provider will consider your medical history, your risk for prostate cancer, your age, and your symptoms before putting you on testosterone replacement therapy. Follow these instructions at home: Take over-the-counter and prescription medicines only as told by your health care provider. Eat foods that are high in fiber, such as beans, whole grains, and fresh fruits and vegetables. Limit foods that are high in fat and processed sugars, such as fried or sweet foods. If you drink alcohol: Limit how much you have to 0-2 drinks a day. Know how much alcohol is in your drink. In the [place] one drink equals one 12 oz bottle of beer (355 mL), one 5 oz glass of wine (148 mL), or one 1 oz glass of hard liquor (44 mL). Return to your normal activities as told by your health care provider. Ask your health care provider what activities are safe for you. Keep all follow-up visits. This is important. Contact a health care provider if: You have any of the signs or symptoms of low testosterone. You have any side effects from testosterone therapy. Summary Male hypogonadism is a condition of having a level of testosterone that is lower than normal. The  natural drop in testosterone production that occurs with age is the most common cause of this condition. Low testosterone can also be caused by many diseases and conditions that affect the testicles and the making of testosterone. This condition is treated with testosterone replacement therapy. There are risks and side effects of testosterone therapy. Your health care provider will consider your age, medical history, symptoms, and risks for prostate cancer before putting you on testosterone therapy. This information is not intended to replace advice given to you by your health care provider. Make sure you discuss any questions you have with your health care provider. Document Revised: 03/05/2020 Document Reviewed: 03/05/2020 Elsevier Patient Education  [place].

## 2022-08-19 ENCOUNTER — Ambulatory Visit: Payer: Self-pay | Admitting: General Surgery

## 2022-08-22 ENCOUNTER — Encounter
Admission: RE | Admit: 2022-08-22 | Discharge: 2022-08-22 | Disposition: A | Discharge status: Home / Self Care | Payer: 59 | Source: Ambulatory Visit | Attending: General Surgery | Admitting: General Surgery

## 2022-08-22 DIAGNOSIS — Z01810 Encounter for preprocedural cardiovascular examination: Secondary | ICD-10-CM | POA: Diagnosis not present

## 2022-08-22 DIAGNOSIS — I10 Essential (primary) hypertension: Secondary | ICD-10-CM

## 2022-08-22 HISTORY — DX: Gastro-esophageal reflux disease without esophagitis: K21.9

## 2022-08-22 HISTORY — DX: Sleep apnea, unspecified: G47.30

## 2022-08-22 HISTORY — DX: Nausea with vomiting, unspecified: R11.2

## 2022-08-22 HISTORY — DX: Umbilical hernia without obstruction or gangrene: K42.9

## 2022-08-22 HISTORY — DX: Other specified postprocedural states: Z98.890

## 2022-08-22 HISTORY — DX: Other specified abnormal findings of blood chemistry: R79.89

## 2022-08-22 HISTORY — DX: Benign lipomatous neoplasm of skin and subcutaneous tissue of trunk: D17.1

## 2022-08-22 NOTE — Patient Instructions (Signed)
Your procedure is scheduled on:08-26-22 Friday Report to the Registration Desk on the 1st floor of the [facility].Then proceed to the 2nd floor Surgery Desk To find out your arrival time, please call [phone] between 1PM - 3PM on:08-25-22 Thursday If your arrival time is 6:00 am, do not arrive prior to that time as the [facility] entrance doors do not open until 6:00 am.  REMEMBER: Instructions that are not followed completely may result in serious medical risk, up to and including death; or upon the discretion of your surgeon and anesthesiologist your surgery may need to be rescheduled.  Do not eat food OR drink any liquids after midnight the night before surgery.  No gum chewing, lozengers or hard candies.  TAKE THESE MEDICATIONS THE MORNING OF SURGERY WITH A SIP OF WATER: -rosuvastatin (CRESTOR)  -omeprazole (PRILOSEC OTC) -take one the night before and one on the morning of surgery - helps to prevent nausea after surgery.).  One week prior to surgery: Stop Anti-inflammatories (NSAIDS) such as Advil, Aleve, Ibuprofen, Motrin, Naproxen, Naprosyn and Aspirin based products such as Excedrin, Goodys Powder, BC Powder.You may however, take Tylenol if needed for pain up until the day of surgery.  Stop ANY OVER THE COUNTER supplements/vitamins NOW (08-22-22) until after surgery.  No Alcohol for 24 hours before or after surgery.  No Smoking including e-cigarettes for 24 hours prior to surgery.  No chewable tobacco products for at least 6 hours prior to surgery.  No nicotine patches on the day of surgery.  Do not use any "recreational" drugs for at least a week prior to your surgery.  Please be advised that the combination of cocaine and anesthesia may have negative outcomes, up to and including death. If you test positive for cocaine, your surgery will be cancelled.  On the morning of surgery brush your teeth with toothpaste and water, you may rinse your mouth with mouthwash if you  wish. Do not swallow any toothpaste or mouthwash.  Use CHG Soap as directed on instruction sheet.  Do not wear jewelry, make-up, hairpins, clips or nail polish.  Do not wear lotions, powders, or perfumes.   Do not shave body from the neck down 48 hours prior to surgery just in case you cut yourself which could leave a site for infection.  Also, freshly shaved skin may become irritated if using the CHG soap.  Contact lenses, hearing aids and dentures may not be worn into surgery.  Do not bring valuables to the hospital. [facility] is not responsible for any missing/lost belongings or valuables.   Notify your doctor if there is any change in your medical condition (cold, fever, infection).  Wear comfortable clothing (specific to your surgery type) to the hospital.  After surgery, you can help prevent lung complications by doing breathing exercises.  Take deep breaths and cough every 1-2 hours. Your doctor may order a device called an Incentive Spirometer to help you take deep breaths. When coughing or sneezing, hold a pillow firmly against your incision with both hands. This is called "splinting." Doing this helps protect your incision. It also decreases belly discomfort.  If you are being admitted to the hospital overnight, leave your suitcase in the car. After surgery it may be brought to your room.  In case of increased patient census, it may be necessary for you, the patient, to continue your postoperative care within the Same Day Surgery department.  If you are being discharged the day of surgery, you will not  be allowed to drive home. You will need a responsible individual to drive you home and stay with you for 24 hours after surgery.   If you are taking public transportation, you will need to have a responsible adult (18 years or older) with you. Please confirm with your physician that it is acceptable to use public transportation.   Please call the [facility]  Dept. at [phone] if you have any questions about these instructions.  Surgery Visitation Policy:  Patients undergoing a surgery or procedure may have two family members or support persons with them as long as the person is not COVID-19 positive or experiencing its symptoms.   Due to an increase in RSV and influenza rates and associated hospitalizations, children ages 74 and under will not be able to visit patients in [facility]. Masks continue to be strongly recommended.

## 2022-08-23 DIAGNOSIS — M7662 Achilles tendinitis, left leg: Secondary | ICD-10-CM

## 2022-08-23 HISTORY — DX: Achilles tendinitis, left leg: M76.62

## 2022-08-26 ENCOUNTER — Other Ambulatory Visit: Payer: Self-pay

## 2022-08-26 ENCOUNTER — Encounter: Payer: Self-pay | Admitting: General Surgery

## 2022-08-26 ENCOUNTER — Ambulatory Visit
Admission: RE | Admit: 2022-08-26 | Discharge: 2022-08-26 | Disposition: A | Discharge status: Home / Self Care | Payer: 59 | Attending: General Surgery | Admitting: General Surgery

## 2022-08-26 ENCOUNTER — Ambulatory Visit: Payer: 59 | Admitting: Anesthesiology

## 2022-08-26 ENCOUNTER — Encounter
Admission: RE | Disposition: A | Discharge status: Home / Self Care | Payer: Self-pay | Source: Home / Self Care | Attending: General Surgery

## 2022-08-26 ENCOUNTER — Ambulatory Visit: Payer: 59 | Admitting: Urgent Care

## 2022-08-26 DIAGNOSIS — D171 Benign lipomatous neoplasm of skin and subcutaneous tissue of trunk: Secondary | ICD-10-CM | POA: Insufficient documentation

## 2022-08-26 DIAGNOSIS — E7800 Pure hypercholesterolemia, unspecified: Secondary | ICD-10-CM | POA: Insufficient documentation

## 2022-08-26 DIAGNOSIS — I10 Essential (primary) hypertension: Secondary | ICD-10-CM | POA: Insufficient documentation

## 2022-08-26 DIAGNOSIS — Z87891 Personal history of nicotine dependence: Secondary | ICD-10-CM | POA: Insufficient documentation

## 2022-08-26 DIAGNOSIS — G4730 Sleep apnea, unspecified: Secondary | ICD-10-CM | POA: Diagnosis not present

## 2022-08-26 DIAGNOSIS — K219 Gastro-esophageal reflux disease without esophagitis: Secondary | ICD-10-CM | POA: Insufficient documentation

## 2022-08-26 DIAGNOSIS — K420 Umbilical hernia with obstruction, without gangrene: Secondary | ICD-10-CM | POA: Insufficient documentation

## 2022-08-26 HISTORY — PX: LIPOMA EXCISION: SHX5283

## 2022-08-26 SURGERY — REPAIR, HERNIA, UMBILICAL, ROBOT-ASSISTED
Anesthesia: General | Site: Abdomen

## 2022-08-26 MED ORDER — DEXAMETHASONE SODIUM PHOSPHATE 10 MG/ML IJ SOLN
INTRAMUSCULAR | Status: AC
  Filled 2022-08-26: qty 1

## 2022-08-26 MED ORDER — ONDANSETRON HCL 4 MG/2ML IJ SOLN
INTRAMUSCULAR | Status: AC
  Filled 2022-08-26: qty 2

## 2022-08-26 MED ORDER — CHLORHEXIDINE GLUCONATE 0.12 % MT SOLN
OROMUCOSAL | Status: AC
  Administered 2022-08-26: 15 mL via OROMUCOSAL
  Filled 2022-08-26: qty 15

## 2022-08-26 MED ORDER — CEFAZOLIN SODIUM-DEXTROSE 2-4 GM/100ML-% IV SOLN
2.0000 g | INTRAVENOUS | Status: AC
  Administered 2022-08-26: 2 g via INTRAVENOUS

## 2022-08-26 MED ORDER — FENTANYL CITRATE (PF) 100 MCG/2ML IJ SOLN
INTRAMUSCULAR | Status: DC | PRN
  Administered 2022-08-26: 100 ug via INTRAVENOUS
  Administered 2022-08-26 (×2): 50 ug via INTRAVENOUS

## 2022-08-26 MED ORDER — EPINEPHRINE 1 MG/10ML IJ SOSY
PREFILLED_SYRINGE | INTRAMUSCULAR | Status: DC | PRN
  Administered 2022-08-26: 30 mL via INTRAMUSCULAR

## 2022-08-26 MED ORDER — OXYCODONE-ACETAMINOPHEN 5-325 MG PO TABS: 1.0000 | ORAL_TABLET | ORAL | 0 refills | Status: AC | PRN

## 2022-08-26 MED ORDER — EPINEPHRINE PF 1 MG/ML IJ SOLN
INTRAMUSCULAR | Status: AC
  Filled 2022-08-26: qty 1

## 2022-08-26 MED ORDER — BUPIVACAINE HCL (PF) 0.25 % IJ SOLN
INTRAMUSCULAR | Status: AC
  Filled 2022-08-26: qty 30

## 2022-08-26 MED ORDER — 0.9 % SODIUM CHLORIDE (POUR BTL) OPTIME
TOPICAL | Status: DC | PRN
  Administered 2022-08-26: 500 mL

## 2022-08-26 MED ORDER — OXYCODONE HCL 5 MG PO TABS
5.0000 mg | ORAL_TABLET | Freq: Once | ORAL | Status: AC
  Administered 2022-08-26: 5 mg via ORAL

## 2022-08-26 MED ORDER — ACETAMINOPHEN 10 MG/ML IV SOLN
INTRAVENOUS | Status: DC | PRN
  Administered 2022-08-26: 1000 mg via INTRAVENOUS

## 2022-08-26 MED ORDER — ACETAMINOPHEN 10 MG/ML IV SOLN
INTRAVENOUS | Status: AC
  Filled 2022-08-26: qty 100

## 2022-08-26 MED ORDER — DEXMEDETOMIDINE HCL IN NACL 80 MCG/20ML IV SOLN
INTRAVENOUS | Status: DC | PRN
  Administered 2022-08-26 (×2): 4 ug via BUCCAL
  Administered 2022-08-26: 8 ug via BUCCAL
  Administered 2022-08-26: 4 ug via BUCCAL

## 2022-08-26 MED ORDER — LIDOCAINE HCL (PF) 2 % IJ SOLN
INTRAMUSCULAR | Status: AC
  Filled 2022-08-26: qty 5

## 2022-08-26 MED ORDER — MIDAZOLAM HCL 2 MG/2ML IJ SOLN
INTRAMUSCULAR | Status: DC | PRN
  Administered 2022-08-26: 2 mg via INTRAVENOUS

## 2022-08-26 MED ORDER — PROPOFOL 500 MG/50ML IV EMUL
INTRAVENOUS | Status: DC | PRN
  Administered 2022-08-26: 150 ug/kg/min via INTRAVENOUS

## 2022-08-26 MED ORDER — KETOROLAC TROMETHAMINE 30 MG/ML IJ SOLN
INTRAMUSCULAR | Status: DC | PRN
  Administered 2022-08-26: 30 mg via INTRAVENOUS

## 2022-08-26 MED ORDER — ONDANSETRON HCL 4 MG/2ML IJ SOLN
INTRAMUSCULAR | Status: DC | PRN
  Administered 2022-08-26: 4 mg via INTRAVENOUS

## 2022-08-26 MED ORDER — FENTANYL CITRATE (PF) 100 MCG/2ML IJ SOLN
INTRAMUSCULAR | Status: AC
  Filled 2022-08-26: qty 2

## 2022-08-26 MED ORDER — FENTANYL CITRATE (PF) 100 MCG/2ML IJ SOLN: 25.0000 ug | INTRAMUSCULAR | Status: DC | PRN

## 2022-08-26 MED ORDER — CEFAZOLIN SODIUM-DEXTROSE 2-4 GM/100ML-% IV SOLN
INTRAVENOUS | Status: AC
  Filled 2022-08-26: qty 100

## 2022-08-26 MED ORDER — MIDAZOLAM HCL 2 MG/2ML IJ SOLN
INTRAMUSCULAR | Status: AC
  Filled 2022-08-26: qty 2

## 2022-08-26 MED ORDER — PROPOFOL 1000 MG/100ML IV EMUL
INTRAVENOUS | Status: AC
  Filled 2022-08-26: qty 100

## 2022-08-26 MED ORDER — PROPOFOL 10 MG/ML IV BOLUS
INTRAVENOUS | Status: AC
  Filled 2022-08-26: qty 20

## 2022-08-26 MED ORDER — LACTATED RINGERS IV SOLN: INTRAVENOUS | Status: DC

## 2022-08-26 MED ORDER — DEXAMETHASONE SODIUM PHOSPHATE 10 MG/ML IJ SOLN
INTRAMUSCULAR | Status: DC | PRN
  Administered 2022-08-26: 10 mg via INTRAVENOUS

## 2022-08-26 MED ORDER — OXYCODONE HCL 5 MG PO TABS
ORAL_TABLET | ORAL | Status: AC
  Filled 2022-08-26: qty 1

## 2022-08-26 MED ORDER — SUGAMMADEX SODIUM 200 MG/2ML IV SOLN
INTRAVENOUS | Status: DC | PRN
  Administered 2022-08-26: 200 mg via INTRAVENOUS

## 2022-08-26 MED ORDER — LIDOCAINE HCL (CARDIAC) PF 100 MG/5ML IV SOSY
PREFILLED_SYRINGE | INTRAVENOUS | Status: DC | PRN
  Administered 2022-08-26: 100 mg via INTRAVENOUS

## 2022-08-26 MED ORDER — CHLORHEXIDINE GLUCONATE 0.12 % MT SOLN: 15.0000 mL | Freq: Once | OROMUCOSAL | Status: AC

## 2022-08-26 MED ORDER — ROCURONIUM BROMIDE 10 MG/ML (PF) SYRINGE
PREFILLED_SYRINGE | INTRAVENOUS | Status: AC
  Filled 2022-08-26: qty 10

## 2022-08-26 MED ORDER — ORAL CARE MOUTH RINSE: 15.0000 mL | Freq: Once | OROMUCOSAL | Status: AC

## 2022-08-26 MED ORDER — ROCURONIUM BROMIDE 100 MG/10ML IV SOLN
INTRAVENOUS | Status: DC | PRN
  Administered 2022-08-26 (×5): 10 mg via INTRAVENOUS
  Administered 2022-08-26: 50 mg via INTRAVENOUS

## 2022-08-26 MED ORDER — PROPOFOL 10 MG/ML IV BOLUS
INTRAVENOUS | Status: DC | PRN
  Administered 2022-08-26: 200 mg via INTRAVENOUS

## 2022-08-26 MED ORDER — PROMETHAZINE HCL 25 MG/ML IJ SOLN: 6.2500 mg | INTRAMUSCULAR | Status: DC | PRN

## 2022-08-26 SURGICAL SUPPLY — 55 items
BAG PRESSURE INF REUSE 1000 (BAG) IMPLANT
BLADE SURG SZ11 CARB STEEL (BLADE) ×1 IMPLANT
COVER TIP SHEARS 8 DVNC (MISCELLANEOUS) ×1 IMPLANT
COVER TIP SHEARS 8MM DA VINCI (MISCELLANEOUS) ×1
COVER WAND RF STERILE (DRAPES) ×1 IMPLANT
DERMABOND ADVANCED .7 DNX12 (GAUZE/BANDAGES/DRESSINGS) ×1 IMPLANT
DRAPE ARM DVNC X/XI (DISPOSABLE) ×3 IMPLANT
DRAPE COLUMN DVNC XI (DISPOSABLE) ×1 IMPLANT
DRAPE DA VINCI XI ARM (DISPOSABLE) ×3
DRAPE DA VINCI XI COLUMN (DISPOSABLE) ×1
ELECT REM PT RETURN 9FT ADLT (ELECTROSURGICAL) ×1
ELECTRODE REM PT RTRN 9FT ADLT (ELECTROSURGICAL) ×1 IMPLANT
GLOVE BIO SURGEON STRL SZ 6.5 (GLOVE) ×2 IMPLANT
GLOVE BIOGEL PI IND STRL 6.5 (GLOVE) ×2 IMPLANT
GOWN STRL REUS W/ TWL LRG LVL3 (GOWN DISPOSABLE) ×3 IMPLANT
GOWN STRL REUS W/TWL LRG LVL3 (GOWN DISPOSABLE) ×3
GRASPER SUT TROCAR 14GX15 (MISCELLANEOUS) IMPLANT
IRRIGATOR SUCT 8 DISP DVNC XI (IRRIGATION / IRRIGATOR) IMPLANT
IRRIGATOR SUCTION 8MM XI DISP (IRRIGATION / IRRIGATOR)
IV CATH ANGIO 12GX3 LT BLUE (NEEDLE) IMPLANT
IV NS 1000ML (IV SOLUTION)
IV NS 1000ML BAXH (IV SOLUTION) IMPLANT
KIT PINK PAD W/HEAD ARE REST (MISCELLANEOUS) ×1
KIT PINK PAD W/HEAD ARM REST (MISCELLANEOUS) ×1 IMPLANT
LABEL OR SOLS (LABEL) ×1 IMPLANT
MANIFOLD NEPTUNE II (INSTRUMENTS) ×1 IMPLANT
MESH PROGRIP HERNIA FLAT 15X15 (Mesh General) IMPLANT
NDL INSUFFLATION 14GA 120MM (NEEDLE) ×1 IMPLANT
NEEDLE HYPO 22GX1.5 SAFETY (NEEDLE) ×1 IMPLANT
NEEDLE INSUFFLATION 14GA 120MM (NEEDLE) ×2 IMPLANT
NS IRRIG 500ML POUR BTL (IV SOLUTION) ×1 IMPLANT
OBTURATOR OPTICAL STANDARD 8MM (TROCAR) ×1
OBTURATOR OPTICAL STND 8 DVNC (TROCAR) ×1
OBTURATOR OPTICALSTD 8 DVNC (TROCAR) ×1 IMPLANT
PACK LAP CHOLECYSTECTOMY (MISCELLANEOUS) ×1 IMPLANT
PENCIL SMOKE EVACUATOR (MISCELLANEOUS) IMPLANT
SEAL CANN UNIV 5-8 DVNC XI (MISCELLANEOUS) ×3 IMPLANT
SEAL XI 5MM-8MM UNIVERSAL (MISCELLANEOUS) ×3
SET TUBE SMOKE EVAC HIGH FLOW (TUBING) ×1 IMPLANT
SOL ELECTROSURG ANTI STICK (MISCELLANEOUS) ×1
SOLUTION ELECTROSURG ANTI STCK (MISCELLANEOUS) ×1 IMPLANT
SUT MNCRL 4-0 (SUTURE) ×2
SUT MNCRL 4-0 27XMFL (SUTURE) ×2
SUT STRATAFIX PDS 30 CT-1 (SUTURE) ×1 IMPLANT
SUT VIC AB 2-0 SH 27 (SUTURE) ×1
SUT VIC AB 2-0 SH 27XBRD (SUTURE) IMPLANT
SUT VIC AB 3-0 SH 27 (SUTURE) ×1
SUT VIC AB 3-0 SH 27X BRD (SUTURE) IMPLANT
SUT VICRYL 0 UR6 27IN ABS (SUTURE) ×1 IMPLANT
SUT VLOC 90 2/L VL 12 GS22 (SUTURE) ×2 IMPLANT
SUT VLOC 90 6 CV-15 VIOLET (SUTURE) IMPLANT
SUTURE MNCRL 4-0 27XMF (SUTURE) ×1 IMPLANT
TAPE TRANSPORE STRL 2 31045 (GAUZE/BANDAGES/DRESSINGS) ×1 IMPLANT
TRAP FLUID SMOKE EVACUATOR (MISCELLANEOUS) ×1 IMPLANT
WATER STERILE IRR 500ML POUR (IV SOLUTION) ×1 IMPLANT

## 2022-08-26 NOTE — H&P (Signed)
PATIENT PROFILE: Jay Trujillo is a 52 y.o. male who presents to the Clinic for consultation at the request of Dr. Kym Groom for evaluation of umbilical hernia and multiple abdominal wall lipomas.  PCP: Valera Castle, MD  HISTORY OF PRESENT ILLNESS: Jay Trujillo reports he has been having an umbilical hernia for little bit more than a year. He has been having more frequent pain in the periumbilical area. Pain does not radiate to other part of body. Pain aggravated by physical activity. No alleviating factors. Patient denies any episode of abdominal distention, nausea or vomiting.  Patient also endorses that he has been feeling the soft tissue masses in the abdominal wall. One is located on the right costal margin and the other 1 is to the right upper umbilical soft tissue mass. These are bothering him and causing pain and increasing size.  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 (SPLIT 09/06/17: Overall AHI 27/hr, NREM AHI 28/hr, Lateral AHI 26/hr) (Chronic) 09/08/2017  Chronic insomnia 07/25/2017  Overview  Sleep walking on [place]. Trazodone started 1/19   Skin masses - multiple 01/20/2017  Chest pain Unknown  Possible Sleep Apnea 03/14/2014  Overview  Study ordered. Patient declined 2/2 cost   Difficulty concentrating 12/16/2013  Irritability 01/10/2013  Back pain 12/09/2011  ALLERGIC RHINITIS 03/14/2011  HTN (hypertension) 02/10/2011   GENERAL REVIEW OF SYSTEMS:   General ROS: negative for - chills, fatigue, fever, weight gain or weight loss Allergy and Immunology ROS: negative for - hives  Hematological and Lymphatic ROS: negative for - bleeding problems or bruising, negative for palpable nodes Endocrine ROS: negative for - heat or cold intolerance, hair changes Respiratory ROS: negative for - cough, shortness of breath or wheezing Cardiovascular ROS: no chest  pain or palpitations GI ROS: negative for nausea, vomiting, abdominal pain, diarrhea, constipation Musculoskeletal ROS: negative for - joint swelling or muscle pain Neurological ROS: negative for - confusion, syncope Dermatological ROS: negative for pruritus and rash Psychiatric: negative for anxiety, depression, difficulty sleeping and memory loss  MEDICATIONS: Current Outpatient Medications  Medication Sig Dispense Refill  lisinopriL (ZESTRIL) 20 MG tablet TAKE 1 TABLET BY MOUTH EVERY DAY 90 tablet 0  rosuvastatin (CRESTOR) 10 MG tablet TAKE 1 TABLET BY MOUTH EVERY DAY 90 tablet 0  dextroamphetamine-amphetamine (ADDERALL XR) 20 MG XR capsule Take 20 mg by mouth every morning  dextroamphetamine-amphetamine (ADDERALL) 10 mg tablet Take 10 mg by mouth Daily after lunch  fluticasone propionate (FLONASE) 50 mcg/actuation nasal spray Place 2 sprays into both nostrils once daily 16 g 0  methylPREDNISolone (MEDROL DOSEPACK) 4 mg tablet Follow package directions. 21 tablet 0   No current facility-administered medications for this visit.   ALLERGIES: Patient has no known allergies.  PAST MEDICAL HISTORY: Past Medical History:  Diagnosis Date  Allergic state  Anxiety  Chest pain  Essential hypertension, benign  GERD (gastroesophageal reflux disease)  History of chest pain  abnormal EKG, chest pain, shortness of breath, hypertension, hyperlipidemia, family history of cardiovascular disease  History of foot sprain  left midfoot  Hyperlipidemia  Hypertension  Other and unspecified angina pectoris (CMS-HCC)  Plantar fasciitis, left   PAST SURGICAL HISTORY: Past Surgical History:  Procedure Laterality Date  Removal of lipomas    FAMILY HISTORY: Family History  Problem Relation Age of Onset  Diabetes Mother  High blood pressure (Hypertension) Mother  Heart disease Father  early onset of cardiovascular disease  and myocardial infarction  Myocardial Infarction (Heart attack) Father     SOCIAL HISTORY: Social History   Socioeconomic History  Marital status: Married  Tobacco Use  Smoking status: Former  Packs/day: 1.00  Years: 10.00  Additional pack years: 0.00  Total pack years: 10.00  Types: Cigarettes  Quit date: 2005  Years since quitting: 19.0  Smokeless tobacco: Never  Tobacco comments:  Remote tobacco use  Vaping Use  Vaping Use: Never used  Substance and Sexual Activity  Alcohol use: Not Currently  Comment: occ  Drug use: No  Sexual activity: Yes  Partners: Female  Social History Narrative  Married, lives with wife and one daughter and two sons. Another daughter is grown and out of the home. Associate's degree. 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 No current exercise.   PHYSICAL EXAM: Vitals:  07/21/22 0820  BP: (!) 162/90  Pulse: 87   Body mass index is 29.29 kg/m. Weight: 95.3 kg (210 lb)   GENERAL: Alert, active, oriented x3  HEENT: Pupils equal reactive to light. Extraocular movements are intact. Sclera clear. Palpebral conjunctiva normal red color.Pharynx clear.  NECK: Supple with no palpable mass and no adenopathy.  LUNGS: Sound clear with no rales rhonchi or wheezes.  HEART: Regular rhythm S1 and S2 without murmur.  ABDOMEN: Soft and depressible, nontender with no palpable mass, no hepatomegaly. Incarcerated fat umbilical hernia.  EXTREMITIES: Well-developed well-nourished symmetrical with no dependent edema.  NEUROLOGICAL: Awake alert oriented, facial expression symmetrical, moving all extremities.  REVIEW OF DATA: I have reviewed the following data today: No visits with results within 3 Month(s) from this visit.  Latest known visit with results is:  Office Visit on 12/06/2021  Component Date Value  POC Strep A 12/06/2021 Negative  POC Strep A 12/06/2021 Negative  Coronavirus (COVID-19) S* 12/06/2021 Not Detected  Influenza A RNA 12/06/2021 Not Detected  Influenza B RNA 12/06/2021 Not Detected  Respiratory  Syncytial Vi* 12/06/2021 Not Detected  Parainfluenza 1 RNA 12/06/2021 Not Detected  Parainfluenza 2 RNA 12/06/2021 Not Detected  Parainfluenza 3 RNA 12/06/2021 Not Detected  Parainfluenza 4 RNA 12/06/2021 Not Detected  Human Metapneumovirus RNA 12/06/2021 Not Detected  Adenovirus DNA 12/06/2021 Not Detected  Human Rhinovirus/Enterov* 12/06/2021 Not Detected  Culture Other, aerobes o* 12/06/2021 2+ Mixed gram positive organisms (!)  Culture Other, aerobes o* 12/06/2021 1+ Staphylococcus aureus (!)  Gram Stain 12/06/2021 No White Blood Cells  Gram Stain 12/06/2021 No organisms seen    ASSESSMENT: Jay Trujillo is a 52 y.o. male presenting for consultation for umbilical hernia.   The patient presents with a symptomatic umbilical hernia. Patient was oriented about the diagnosis of umbilical hernia and its implication. The patient was oriented about the treatment alternatives (observation vs surgical repair). Due to patient symptoms, repair is recommended. Patient oriented about the surgical procedure, the use of mesh and its risk of complications such as: infection, bleeding, injury to vasculature, injury to bowel or bladder, and chronic pain, intestinal obstruction, among others.   Also discussed about the excision of 2 lipomas 1 in the right costal margin and the other 1 in the right superior paramedical area.  Umbilical hernia, incarcerated [K42.0]  PLAN: 1. Robotic assisted laparoscopic umbilical hernia repair with mesh [id number]) 2. Excision of lipomas of the abdominal wall (11046) 3. CBC, CMP 4. Avoid taking aspirin 5 days before surgery 5. Contact us if you have any concern  Patient and/or representative verbalized understanding, all questions were answered, and were agreeable with the plan outlined above.  Herbert Pun, MD

## 2022-08-26 NOTE — Discharge Instructions (Addendum)
  Diet: Resume home heart healthy regular diet.   Activity: No heavy lifting >20 pounds (children, pets, laundry, garbage) or strenuous activity until follow-up, but light activity and walking are encouraged. Do not drive or drink alcohol if taking narcotic pain medications.  Wound care: May shower with soapy water and pat dry (do not rub incisions), but no baths or submerging incision underwater until follow-up. (no swimming)   Medications: Resume all home medications. For mild to moderate pain: acetaminophen (Tylenol) or ibuprofen (if no kidney disease). Combining Tylenol with alcohol can substantially increase your risk of causing liver disease. Narcotic pain medications, if prescribed, can be used for severe pain, though may cause nausea, constipation, and drowsiness. If you do not need the narcotic pain medication, you do not need to fill the prescription.  Call office [phone]) at any time if any questions, worsening pain, fevers/chills, bleeding, drainage from incision site, or other concerns. AMBULATORY SURGERY  DISCHARGE INSTRUCTIONS   The drugs that you were given will stay in your system until tomorrow so for the next 24 hours you should not:  Drive an automobile Make any legal decisions Drink any alcoholic beverage   You may resume regular meals tomorrow.  Today it is better to start with liquids and gradually work up to solid foods.  You may eat anything you prefer, but it is better to start with liquids, then soup and crackers, and gradually work up to solid foods.   Please notify your doctor immediately if you have any unusual bleeding, trouble breathing, redness and pain at the surgery site, drainage, fever, or pain not relieved by medication.     Your post-operative visit with Dr.                                       is: Date:                        Time:    Please call to schedule your post-operative visit.  Additional Instructions: Got Tylenol at 2:30 pm  and NSAID, (same as ibuprofen), at 3:00 pm.

## 2022-08-26 NOTE — Anesthesia Postprocedure Evaluation (Signed)
Anesthesia Post Note  Patient: Designer, television/film set  Procedure(s) Performed: XI ROBOT ASSISTED UMBILICAL HERNIA REPAIR (Abdomen) EXCISION LIPOMA (Abdomen)  Patient location during evaluation: PACU Anesthesia Type: General Level of consciousness: awake and alert Pain management: pain level controlled Vital Signs Assessment: post-procedure vital signs reviewed and stable Respiratory status: spontaneous breathing, nonlabored ventilation, respiratory function stable and patient connected to nasal cannula oxygen Cardiovascular status: blood pressure returned to baseline and stable Postop Assessment: no apparent nausea or vomiting Anesthetic complications: no   No notable events documented.   Last Vitals:  Vitals:   08/26/22 1600 08/26/22 1615  BP: 119/73 119/79  Pulse: 83 83  Resp: 16 15  Temp:    SpO2: 97% 95%    Last Pain:  Vitals:   08/26/22 1545  TempSrc:   PainSc: 4                  Ilene Qua

## 2022-08-26 NOTE — Anesthesia Preprocedure Evaluation (Signed)
Anesthesia Evaluation  Patient identified by MRN, date of birth, ID band Patient awake    Reviewed: Allergy & Precautions, H&P , NPO status , Patient's Chart, lab work & pertinent test results, reviewed documented beta blocker date and time   History of Anesthesia Complications (+) PONV and history of anesthetic complications  Airway Mallampati: II  TM Distance: >3 FB Neck ROM: full    Dental  (+) Dental Advidsory Given, Teeth Intact Permanent retainer on the top front:   Pulmonary neg shortness of breath, sleep apnea , neg COPD, neg recent URI, former smoker   Pulmonary exam normal breath sounds clear to auscultation       Cardiovascular Exercise Tolerance: Good hypertension, (-) angina (-) Past MI and (-) Cardiac Stents Normal cardiovascular exam(-) dysrhythmias (-) Valvular Problems/Murmurs Rhythm:regular Rate:Normal     Neuro/Psych negative neurological ROS  negative psych ROS   GI/Hepatic Neg liver ROS,GERD  ,,  Endo/Other  negative endocrine ROS    Renal/GU negative Renal ROS  negative genitourinary   Musculoskeletal   Abdominal   Peds  Hematology negative hematology ROS (+)   Anesthesia Other Findings Past Medical History: No date: GERD (gastroesophageal reflux disease) No date: Hypercholesteremia No date: Hypertension No date: Lipoma of abdominal wall     Comment:  multiple No date: Low testosterone in male No date: PONV (postoperative nausea and vomiting) No date: Sleep apnea     Comment:  cannot tolerate cpap No date: Umbilical hernia   Reproductive/Obstetrics negative OB ROS                             Anesthesia Physical Anesthesia Plan  ASA: 2  Anesthesia Plan: General   Post-op Pain Management:    Induction: Intravenous  PONV Risk Score and Plan: 3 and Treatment may vary due to age or medical condition, Propofol infusion and TIVA  Airway Management Planned:  Oral ETT  Additional Equipment:   Intra-op Plan:   Post-operative Plan: Extubation in OR  Informed Consent: I have reviewed the patients History and Physical, chart, labs and discussed the procedure including the risks, benefits and alternatives for the proposed anesthesia with the patient or authorized representative who has indicated his/her understanding and acceptance.     Dental Advisory Given  Plan Discussed with: Anesthesiologist, CRNA and Surgeon  Anesthesia Plan Comments:        Anesthesia Quick Evaluation

## 2022-08-26 NOTE — Anesthesia Procedure Notes (Signed)
Procedure Name: Intubation Date/Time: 08/26/2022 12:52 PM  Performed by: Aline Brochure, CRNAPre-anesthesia Checklist: Patient identified, Patient being monitored, Timeout performed, Emergency Drugs available and Suction available Patient Re-evaluated:Patient Re-evaluated prior to induction Oxygen Delivery Method: Circle system utilized Preoxygenation: Pre-oxygenation with 100% oxygen Induction Type: IV induction Ventilation: Two handed mask ventilation required Laryngoscope Size: McGraph and 4 Grade View: Grade I Tube type: Oral Tube size: 7.5 mm Number of attempts: 1 Airway Equipment and Method: Stylet and Video-laryngoscopy Placement Confirmation: ETT inserted through vocal cords under direct vision, positive ETCO2 and breath sounds checked- equal and bilateral Secured at: 22 cm Tube secured with: Tape Dental Injury: Teeth and Oropharynx as per pre-operative assessment

## 2022-08-26 NOTE — Transfer of Care (Signed)
Immediate Anesthesia Transfer of Care Note  Patient: Jay Trujillo  Procedure(s) Performed: XI ROBOT ASSISTED UMBILICAL HERNIA REPAIR (Abdomen) EXCISION LIPOMA (Abdomen)  Patient Location: PACU  Anesthesia Type:General  Level of Consciousness: awake  Airway & Oxygen Therapy: Patient Spontanous Breathing and Patient connected to nasal cannula oxygen  Post-op Assessment: Report given to RN and Post -op Vital signs reviewed and stable  Post vital signs: Reviewed and stable  Last Vitals:  Vitals Value Taken Time  BP    Temp    Pulse    Resp    SpO2      Last Pain:  Vitals:   08/26/22 1016  TempSrc: Temporal  PainSc: 0-No pain         Complications: No notable events documented.

## 2022-08-26 NOTE — Op Note (Signed)
Preoperative diagnosis: Umbilical Hernia   Postoperative diagnosis: Umbilical Hernia   Procedure: Robotic assisted laparoscopic transabdominal pre peritoneal umbilical hernia repair with mesh  Anesthesia: General   Surgeon: Herbert Pun, MD, FACS   Wound Classification: Clean   Specimen: None   Complications: None   Estimated Blood Loss: 5 mL   Indications: A 52 year old male with symptomatic umbilical hernia. Repair indicated to improve pain and avoid complications such as incarceration or strangulation.    Findings: 3 cm umbilical incarcerated hernia 2. Tension free repair achieved with 15 x 15 cm Progrip mesh 3. Adequate hemostasis   Description of procedure: The patient was brought to the operating room and general anesthesia was induced. A time-out was completed verifying correct patient, procedure, site, positioning, and implant(s) and/or special equipment prior to beginning this procedure. Antibiotics were administered prior to making the incision. SCDs placed. The anterior abdominal wall was prepped and draped in the standard sterile fashion.    Palmer's point chosen for entry.  Veress needle placed and abdomen insufflated to 15cm without any dramatic increase in pressure.  Needle removed and optiview technique used to place 8 mm port at same point.  No injury noted during placement. Two additional ports were placed along left lateral aspect.  Xi robot then docked into place.     A pre peritoneal flap was started 6 cm lateral to the umbilical defect. The pre peritoneal flap was extended 6 cm away from the hernia defect in all directions. The Hernia sac and content were reduced.    Insufflation dropped to 8 mm and transfacial suture with 0 stratafix used to primarily close defect under minimal tension. Progrip 15 x 15 cm  mesh was placed within the the pre peritoneal flap and self attached to the anterior abdominal wall centered over the defect.  The pre peritoneal flap  was closed using 2-0 VLock.  Any peritoneal defect was closed with 3-0 vicryl.     Robot was undocked.  Abdomen then desufflated while camera within abdomen to ensure no signs of new bleed prior to removing camera and rest of ports completely.    Then I proceed to do excision of 4 abdominal wall lipomas. These soft tissue masses were in separated incisions. Each lipoma measured 2-3 cm each for a total of 9 cm. Excision was done in the following fashion:  incision was made over each lipoma, and cautery was used to dissect down the subcutaneous tissue to the lipoma itself.  Skin flaps were created using cautery as well, and then the lipoma was excised using cautery, intact.  They were sent off to pathology.  The cavity was then irrigated and hemostasis was assured with cautery.  Local anesthetic was infused intradermally.  The wound was then closed in two layers using 3-0 Vicryl and 4-0 Monocryl.  All skin incisions from the laparoscopic hernia repair were closed with 4-0 Monocryl in a subcuticular fashion.  All wounds then dressed with Dermabond.   Patient was then successfully awakened and transferred to PACU in stable condition.  At the end of the procedure sponge and instrument counts were correct.

## 2022-08-29 ENCOUNTER — Encounter: Payer: Self-pay | Admitting: General Surgery

## 2022-08-30 LAB — SURGICAL PATHOLOGY

## 2022-09-07 ENCOUNTER — Inpatient Hospital Stay: Admission: RE | Admit: 2022-09-07 | Payer: 59 | Source: Ambulatory Visit

## 2022-10-18 ENCOUNTER — Ambulatory Visit: Payer: 59 | Admitting: Urology

## 2022-10-19 ENCOUNTER — Ambulatory Visit: Payer: 59 | Admitting: Urology

## 2023-05-21 ENCOUNTER — Other Ambulatory Visit: Payer: Self-pay

## 2023-05-21 ENCOUNTER — Emergency Department: Payer: 59

## 2023-05-21 ENCOUNTER — Emergency Department
Admission: EM | Admit: 2023-05-21 | Discharge: 2023-05-22 | Disposition: A | Discharge status: Home / Self Care | Payer: 59 | Attending: Emergency Medicine | Admitting: Emergency Medicine

## 2023-05-21 DIAGNOSIS — R0789 Other chest pain: Secondary | ICD-10-CM | POA: Insufficient documentation

## 2023-05-21 DIAGNOSIS — D72829 Elevated white blood cell count, unspecified: Secondary | ICD-10-CM | POA: Insufficient documentation

## 2023-05-21 DIAGNOSIS — I10 Essential (primary) hypertension: Secondary | ICD-10-CM | POA: Diagnosis not present

## 2023-05-21 DIAGNOSIS — Z79899 Other long term (current) drug therapy: Secondary | ICD-10-CM | POA: Insufficient documentation

## 2023-05-21 LAB — BASIC METABOLIC PANEL
Anion gap: 8 (ref 5–15)
BUN: 13 mg/dL (ref 6–20)
CO2: 27 mmol/L (ref 22–32)
Calcium: 9.7 mg/dL (ref 8.9–10.3)
Chloride: 98 mmol/L (ref 98–111)
Creatinine, Ser: 1.2 mg/dL (ref 0.61–1.24)
GFR, Estimated: >60 mL/min (ref >60)
Glucose, Bld: 95 mg/dL (ref 70–99)
Potassium: 4 mmol/L (ref 3.5–5.1)
Sodium: 133 mmol/L — ABNORMAL LOW (ref 135–145)

## 2023-05-21 LAB — CBC
HCT: 53 % — ABNORMAL HIGH (ref 39.0–52.0)
Hemoglobin: 18 g/dL — ABNORMAL HIGH (ref 13.0–17.0)
MCH: 29.8 pg (ref 26.0–34.0)
MCHC: 34 g/dL (ref 30.0–36.0)
MCV: 87.6 fL (ref 80.0–100.0)
Platelets: 555 10*3/uL — ABNORMAL HIGH (ref 150–400)
RBC: 6.05 MIL/uL — ABNORMAL HIGH (ref 4.22–5.81)
RDW: 13.1 % (ref 11.5–15.5)
WBC: 11.3 10*3/uL — ABNORMAL HIGH (ref 4.0–10.5)
nRBC: 0 % (ref 0.0–0.2)

## 2023-05-21 LAB — HEPATIC FUNCTION PANEL
ALT: 33 U/L (ref 0–44)
AST: 29 U/L (ref 15–41)
Albumin: 4.2 g/dL (ref 3.5–5.0)
Alkaline Phosphatase: 59 U/L (ref 38–126)
Bilirubin, Direct: 0.2 mg/dL (ref 0.0–0.2)
Indirect Bilirubin: 0.9 mg/dL (ref 0.3–0.9)
Total Bilirubin: 1.1 mg/dL (ref 0.3–1.2)
Total Protein: 7.8 g/dL (ref 6.5–8.1)

## 2023-05-21 LAB — TROPONIN I (HIGH SENSITIVITY)
Troponin I (High Sensitivity): 6 ng/L (ref <18)
Troponin I (High Sensitivity): 5 ng/L (ref <18)

## 2023-05-21 LAB — LIPASE, BLOOD: Lipase: 27 U/L (ref 11–51)

## 2023-05-21 MED ORDER — ALUM & MAG HYDROXIDE-SIMETH 200-200-20 MG/5ML PO SUSP
30.0000 mL | Freq: Once | ORAL | Status: AC
  Administered 2023-05-22: 30 mL via ORAL
  Filled 2023-05-21: qty 30

## 2023-05-21 MED ORDER — HYDROCODONE-ACETAMINOPHEN 5-325 MG PO TABS
1.0000 | ORAL_TABLET | Freq: Once | ORAL | Status: AC
  Administered 2023-05-22: 1 via ORAL
  Filled 2023-05-21: qty 1

## 2023-05-21 MED ORDER — ONDANSETRON 4 MG PO TBDP
4.0000 mg | ORAL_TABLET | Freq: Once | ORAL | Status: AC
  Administered 2023-05-22: 4 mg via ORAL
  Filled 2023-05-21: qty 1

## 2023-05-21 MED ORDER — LIDOCAINE VISCOUS HCL 2 % MT SOLN
15.0000 mL | Freq: Once | OROMUCOSAL | Status: AC
  Administered 2023-05-22: 15 mL via OROMUCOSAL
  Filled 2023-05-21: qty 15

## 2023-05-21 MED ORDER — PANTOPRAZOLE SODIUM 40 MG PO TBEC
80.0000 mg | DELAYED_RELEASE_TABLET | Freq: Once | ORAL | Status: AC
  Administered 2023-05-22: 80 mg via ORAL
  Filled 2023-05-21: qty 2

## 2023-05-21 NOTE — ED Triage Notes (Signed)
Pt c/o central intermittent CP that started last night. Pt denies cardiac history, has HTN. Pt report mild lightheadedness and nausea and states he can't eat or drink due to discomfort. Pt AOX4, NAD noted.

## 2023-05-21 NOTE — ED Provider Notes (Signed)
[facility] Provider Note    Event Date/Time   First MD Initiated Contact with Patient 05/21/23 2327     (approximate)   History   Chest Pain   HPI  Jay Trujillo is a 52 y.o. male with history of hypertension, hyperlipidemia, GERD who presents to the emergency department with chest discomfort that started today.  States it feels like indigestion but he has never had it last this long.  Has taken Tums and other over-the-counter medication without relief.  Has had nausea, vomiting, shortness of breath.  States pain is worse with eating and drinking.  No bloody stools or melena.  No abdominal pain.  No history of ACS, PE or DVT.   History provided by patient, wife.    Past Medical History:  Diagnosis Date   GERD (gastroesophageal reflux disease)    Hypercholesteremia    Hypertension    Lipoma of abdominal wall    multiple   Low testosterone in male    PONV (postoperative nausea and vomiting)    Sleep apnea    cannot tolerate cpap   Umbilical hernia     Past Surgical History:  Procedure Laterality Date   LIPOMA EXCISION     x2   LIPOMA EXCISION N/A 08/26/2022   Procedure: EXCISION LIPOMA;  Surgeon: Carolan Shiver, MD;  Location: [facility] ORS;  Service: General;  Laterality: N/A;    MEDICATIONS:  Prior to Admission medications   Medication Sig Start Date End Date Taking? Authorizing Provider  clomiPHENE (CLOMID) 50 MG tablet Take 0.5 tablets (25 mg total) by mouth daily. Patient not taking: Reported on 08/22/2022 08/16/22   Sondra Come, MD  lisinopril (ZESTRIL) 20 MG tablet Take 1 tablet by mouth every morning. 04/22/20 08/02/23  [provider]  omeprazole (PRILOSEC OTC) 20 MG tablet Take 20 mg by mouth as needed.    [provider]  oxyCODONE-acetaminophen (PERCOCET) 5-325 MG tablet Take 1 tablet by mouth every 4 (four) hours as needed for severe pain. 08/26/22 08/26/23  Carolan Shiver, MD  rosuvastatin  (CRESTOR) 10 MG tablet Take 1 tablet by mouth every morning. 04/22/20   [provider]    Physical Exam   Triage Vital Signs: ED Triage Vitals  Encounter Vitals Group     BP 05/21/23 1951 132/89     Systolic BP Percentile --      Diastolic BP Percentile --      Pulse Rate 05/21/23 1951 92     Resp 05/21/23 1951 18     Temp 05/21/23 1951 98.3 F (36.8 C)     Temp Source 05/21/23 1951 Oral     SpO2 05/21/23 1951 100 %     Weight 05/21/23 1947 185 lb (83.9 kg)     Height 05/21/23 1947 5\' 11"  (1.803 m)     Head Circumference --      Peak Flow --      Pain Score 05/21/23 1947 4     Pain Loc --      Pain Education --      Exclude from Growth Chart --     Most recent vital signs: Vitals:   05/21/23 1951 05/21/23 2126  BP: 132/89 135/89  Pulse: 92 74  Resp: 18 (!) 21  Temp: 98.3 F (36.8 C) 98.4 F (36.9 C)  SpO2: 100% 100%    CONSTITUTIONAL: Alert, responds appropriately to questions. Well-appearing; well-nourished HEAD: Normocephalic, atraumatic EYES: Conjunctivae clear, pupils appear equal, sclera nonicteric ENT: normal  nose; moist mucous membranes NECK: Supple, normal ROM CARD: RRR; S1 and S2 appreciated RESP: Normal chest excursion without splinting or tachypnea; breath sounds clear and equal bilaterally; no wheezes, no rhonchi, no rales, no hypoxia or respiratory distress, speaking full sentences ABD/GI: Non-distended; soft, non-tender, no rebound, no guarding, no peritoneal signs, no tenderness in the upper abdomen BACK: The back appears normal EXT: Normal ROM in all joints; no deformity noted, no edema, no calf tenderness or calf swelling SKIN: Normal color for age and race; warm; no rash on exposed skin NEURO: Moves all extremities equally, normal speech PSYCH: The patient's mood and manner are appropriate.   ED Results / Procedures / Treatments   LABS: (all labs ordered are listed, but only abnormal results are displayed) Labs Reviewed  BASIC  METABOLIC PANEL - Abnormal; Notable for the following components:      Result Value   Sodium 133 (*)    All other components within normal limits  CBC - Abnormal; Notable for the following components:   WBC 11.3 (*)    RBC 6.05 (*)    Hemoglobin 18.0 (*)    HCT 53.0 (*)    Platelets 555 (*)    All other components within normal limits  HEPATIC FUNCTION PANEL  LIPASE, BLOOD  TROPONIN I (HIGH SENSITIVITY)  TROPONIN I (HIGH SENSITIVITY)     EKG:  EKG Interpretation Date/Time:  Sunday May 21 2023 21:20:59 EST Ventricular Rate:  77 PR Interval:  128 QRS Duration:  84 QT Interval:  324 QTC Calculation: 366 R Axis:   47  Text Interpretation: Normal sinus rhythm Normal ECG When compared with ECG of 21-May-2023 19:48, (unconfirmed) Non-specific change in ST segment in Inferior leads Non-specific change in ST segment in Anterior leads Nonspecific T wave abnormality no longer evident in Inferior leads T wave inversion no longer evident in Anterior leads QT has shortened Confirmed by Rochele Raring [phone]) on 05/21/2023 11:12:35 PM         RADIOLOGY: My personal review and interpretation of imaging: Chest x-ray clear.  I have personally reviewed all radiology reports.   DG Chest 2 View  Result Date: 05/21/2023 CLINICAL DATA:  Chest pain EXAM: CHEST - 2 VIEW COMPARISON:  None Available. FINDINGS: Lungs are clear.  No pleural effusion or pneumothorax. The heart is normal in size. Visualized osseous structures are within normal limits. IMPRESSION: Normal chest radiographs. Electronically Signed   By: Charline Bills M.D.   On: 05/21/2023 20:18     PROCEDURES:  Critical Care performed: No      Procedures    IMPRESSION / MDM / ASSESSMENT AND PLAN / ED COURSE  I reviewed the triage vital signs and the nursing notes.    Patient here with atypical chest pain.     DIFFERENTIAL DIAGNOSIS (includes but not limited to):   GERD, esophagitis, esophageal spasm, gastritis,  peptic ulcer, H. pylori, less likely ACS, doubt PE, dissection, pancreatitis, cholecystitis, cholangitis given benign abdominal exam   Patient's presentation is most consistent with acute presentation with potential threat to life or bodily function.   PLAN: Labs from triage show slight leukocytosis.  Normal creatinine, LFTs, lipase.  Troponin x 2 negative.  Chest x-ray reviewed and interpreted by myself and the radiologist and is clear.  EKG is nonischemic.  Symptoms seem to be more GI in nature.  Will give Protonix, Maalox, viscous lidocaine, Zofran, Vicodin and reassess.   MEDICATIONS GIVEN IN ED: Medications  pantoprazole (PROTONIX) EC tablet 80 mg (80  mg Oral Given 05/22/23 0008)  ondansetron (ZOFRAN-ODT) disintegrating tablet 4 mg (4 mg Oral Given 05/22/23 0005)  alum & mag hydroxide-simeth (MAALOX/MYLANTA) 200-200-20 MG/5ML suspension 30 mL (30 mLs Oral Given 05/22/23 0005)  lidocaine (XYLOCAINE) 2 % viscous mouth solution 15 mL (15 mLs Mouth/Throat Given 05/22/23 0005)  HYDROcodone-acetaminophen (NORCO/VICODIN) 5-325 MG per tablet 1 tablet (1 tablet Oral Given 05/22/23 0005)     ED COURSE: Patient reports feeling much better.  He is tolerating p.o. here.  He is comfortable with plan for discharge home.  Recommend avoiding NSAIDs, alcohol and having a bland diet for the next few days.  Will discharge with short course of narcotic pain medication as well as Protonix, lidocaine.  Discussed over-the-counter medications that he can use for acid reflux.  Will give GI follow-up if symptoms not improving.   At this time, I do not feel there is any life-threatening condition present. I reviewed all nursing notes, vitals, pertinent previous records.  All lab and urine results, EKGs, imaging ordered have been independently reviewed and interpreted by myself.  I reviewed all available radiology reports from any imaging ordered this visit.  Based on my assessment, I feel the patient is safe to be  discharged home without further emergent workup and can continue workup as an outpatient as needed. Discussed all findings, treatment plan as well as usual and customary return precautions.  They verbalize understanding and are comfortable with this plan.  Outpatient follow-up has been provided as needed.  All questions have been answered.    CONSULTS:  none   OUTSIDE RECORDS REVIEWED: Reviewed last internal medicine note on 04/07/2023.       FINAL CLINICAL IMPRESSION(S) / ED DIAGNOSES   Final diagnoses:  Atypical chest pain     Rx / DC Orders   ED Discharge Orders          Ordered    HYDROcodone-acetaminophen (NORCO/VICODIN) 5-325 MG tablet  Every 8 hours PRN        05/22/23 0053    ondansetron (ZOFRAN-ODT) 4 MG disintegrating tablet  Every 6 hours PRN        05/22/23 0053    pantoprazole (PROTONIX) 40 MG tablet  Daily        05/22/23 0053    lidocaine (XYLOCAINE) 2 % solution  Every 6 hours PRN        05/22/23 0053             Note:  This document was prepared using Dragon voice recognition software and may include unintentional dictation errors.   Debra Colon, Layla Maw, DO 05/22/23 [phone]

## 2023-05-22 MED ORDER — HYDROCODONE-ACETAMINOPHEN 5-325 MG PO TABS: 2.0000 | ORAL_TABLET | Freq: Three times a day (TID) | ORAL | 0 refills | Status: AC | PRN

## 2023-05-22 MED ORDER — PANTOPRAZOLE SODIUM 40 MG PO TBEC: 40.0000 mg | DELAYED_RELEASE_TABLET | Freq: Every day | ORAL | 1 refills | Status: DC

## 2023-05-22 MED ORDER — LIDOCAINE VISCOUS HCL 2 % MT SOLN: 15.0000 mL | Freq: Four times a day (QID) | OROMUCOSAL | 0 refills | Status: DC | PRN

## 2023-05-22 MED ORDER — ONDANSETRON 4 MG PO TBDP: 4.0000 mg | ORAL_TABLET | Freq: Four times a day (QID) | ORAL | 0 refills | Status: AC | PRN

## 2023-05-22 NOTE — Discharge Instructions (Addendum)
Please avoid NSAIDs such as aspirin (Goody powders), ibuprofen (Motrin, Advil), naproxen (Aleve) as these may worsen your symptoms.  Tylenol 1000 mg every 6 hours is safe to take as long as you have no history of liver problems (heavy alcohol use, cirrhosis, hepatitis).  Please avoid spicy, acidic (citrus fruits, tomato based sauces, salsa), greasy, fatty foods.  Please avoid caffeine and alcohol.  Smoking can also make GERD/acid reflux worse.  Over the counter medications such as TUMS, Maalox or Mylanta, pepcid, Prilosec or Nexium may help with your symptoms.  Do not take Prilosec or Nexium if you are already prescribed a proton pump inhibitor.

## 2023-06-20 ENCOUNTER — Other Ambulatory Visit: Payer: Self-pay

## 2023-06-20 NOTE — Progress Notes (Unsigned)
Celso Amy, PA-C [address]  [address]  [place], [place] [address]  Main: [phone]  Fax: [phone]   Gastroenterology Consultation  Referring Provider:     Wilford Corner,* Primary Care Physician:  Wilford Corner, PA-C Primary Gastroenterologist:  *** Reason for Consultation:     ED follow-up        HPI:   Jay Trujillo is a 52 y.o. y/o male referred for consultation & management  by Wilford Corner, PA-C.  ***  Past Medical History:  Diagnosis Date   GERD (gastroesophageal reflux disease)    Hypercholesteremia    Hypertension    Lipoma of abdominal wall    multiple   Low testosterone in male    PONV (postoperative nausea and vomiting)    Sleep apnea    cannot tolerate cpap   Umbilical hernia     Past Surgical History:  Procedure Laterality Date   LIPOMA EXCISION     x2   LIPOMA EXCISION N/A 08/26/2022   Procedure: EXCISION LIPOMA;  Surgeon: Carolan Shiver, MD;  Location: [facility] ORS;  Service: General;  Laterality: N/A;    Prior to Admission medications   Medication Sig Start Date End Date Taking? Authorizing Provider  amphetamine-dextroamphetamine (ADDERALL XR) 30 MG 24 hr capsule Take 30 mg by mouth every morning. 04/27/23   [provider]  clomiPHENE (CLOMID) 50 MG tablet Take 0.5 tablets (25 mg total) by mouth daily. Patient not taking: Reported on 08/22/2022 08/16/22   Sondra Come, MD  clotrimazole-betamethasone (LOTRISONE) lotion Apply topically 2 (two) times daily. 04/03/23   [provider]  escitalopram (LEXAPRO) 10 MG tablet Take 10 mg by mouth daily. 03/28/23   [provider]  gabapentin (NEURONTIN) 100 MG capsule Take 100 mg by mouth at bedtime. 05/23/23   [provider]  HYDROcodone-acetaminophen (NORCO/VICODIN) 5-325 MG tablet Take 2 tablets by mouth every 8 (eight) hours as needed. 05/22/23   Ward, Layla Maw, DO  lidocaine (XYLOCAINE) 2 % solution Use as directed  15 mLs in the mouth or throat every 6 (six) hours as needed for mouth pain. 05/22/23   Ward, Layla Maw, DO  lisinopril (ZESTRIL) 20 MG tablet Take 1 tablet by mouth every morning. 04/22/20 08/02/23  [provider]  meloxicam (MOBIC) 15 MG tablet Take 15 mg by mouth daily. 06/13/23 07/04/23  [provider]  omeprazole (PRILOSEC OTC) 20 MG tablet Take 20 mg by mouth as needed.    [provider]  ondansetron (ZOFRAN-ODT) 4 MG disintegrating tablet Take 1 tablet (4 mg total) by mouth every 6 (six) hours as needed for nausea or vomiting. 05/22/23   Ward, Layla Maw, DO  oxyCODONE-acetaminophen (PERCOCET) 5-325 MG tablet Take 1 tablet by mouth every 4 (four) hours as needed for severe pain. 08/26/22 08/26/23  Carolan Shiver, MD  pantoprazole (PROTONIX) 40 MG tablet Take 1 tablet (40 mg total) by mouth daily. 05/22/23 07/21/23  Ward, Layla Maw, DO  rosuvastatin (CRESTOR) 10 MG tablet Take 1 tablet by mouth every morning. 04/22/20   [provider]  sucralfate (CARAFATE) 1 g tablet Take 1 g by mouth 4 (four) times daily -  with meals and at bedtime. 05/24/23   [provider]  traZODone (DESYREL) 50 MG tablet Take 50 mg by mouth at bedtime.    [provider]    Family History  Problem Relation Age of Onset   Diabetes Mother      Social History  Tobacco Use   Smoking status: Former    Current packs/day: 0.00    Types: Cigarettes    Quit date: 2003    Years since quitting: 21.9    Passive exposure: Never   Smokeless tobacco: Never  Vaping Use   Vaping status: Never Used  Substance Use Topics   Alcohol use: Yes    Comment: socially   Drug use: No    Allergies as of 06/21/2023   (No Known Allergies)    Review of Systems:    All systems reviewed and negative except where noted in HPI.   Physical Exam:  There were no vitals taken for this visit. No LMP for male patient.  General:   Alert,  Well-developed, well-nourished, pleasant and  cooperative in NAD Lungs:  Respirations even and unlabored.  Clear throughout to auscultation.   No wheezes, crackles, or rhonchi. No acute distress. Heart:  Regular rate and rhythm; no murmurs, clicks, rubs, or gallops. Abdomen:  Normal bowel sounds.  No bruits.  Soft, and non-distended without masses, hepatosplenomegaly or hernias noted.  No Tenderness.  No guarding or rebound tenderness.    Neurologic:  Alert and oriented x3;  grossly normal neurologically. Psych:  Alert and cooperative. Normal mood and affect.  Imaging Studies: DG Chest 2 View  Result Date: 05/21/2023 CLINICAL DATA:  Chest pain EXAM: CHEST - 2 VIEW COMPARISON:  None Available. FINDINGS: Lungs are clear.  No pleural effusion or pneumothorax. The heart is normal in size. Visualized osseous structures are within normal limits. IMPRESSION: Normal chest radiographs. Electronically Signed   By: Charline Bills M.D.   On: 05/21/2023 20:18    Assessment and Plan:   Jay Trujillo is a 52 y.o. y/o male has been referred for ***  Follow up ***  Celso Amy, PA-C    BP check ***

## 2023-06-21 ENCOUNTER — Ambulatory Visit: Payer: 59 | Admitting: Physician Assistant

## 2023-07-25 DIAGNOSIS — R079 Chest pain, unspecified: Secondary | ICD-10-CM | POA: Insufficient documentation

## 2023-07-25 HISTORY — DX: Chest pain, unspecified: R07.9

## 2023-07-26 NOTE — Progress Notes (Deleted)
 Ellouise Console, PA-C [address]  [address]  [place], [place] [address]  Main: [phone]  Fax: [phone]   Gastroenterology Consultation  Referring Provider:     Cyrus Selinda Moose,* Primary Care Physician:  Cyrus Selinda Moose, PA-C Primary Gastroenterologist:  *** Reason for Consultation:     ED F/U GERD        HPI:   Kyran Connaughton is a 53 y.o. y/o male referred for consultation & management  by Cyrus Selinda Moose, PA-C.    Patient went to [facility] ED 05/21/2023 to evaluate chest pain.  Diagnosed with GERD.  Chest x-ray and EKG normal.  Negative troponin x 2.  Normal LFTs and lipase.  Treated with Protonix , Maalox, viscous lidocaine  with relief.  Told to follow-up with GI.  No previous EGD, colonoscopy, or GI evaluation.  Past Medical History:  Diagnosis Date   Achilles tendinitis of left lower extremity 08/23/2022   Allergic rhinitis 03/14/2011   Back pain 12/09/2011   Chest pain 07/25/2023   Chronic insomnia 07/25/2017   Sleep walking on ambien. Trazodone started 1/19     GERD (gastroesophageal reflux disease)    HTN (hypertension) 02/10/2011   Hypercholesteremia    Hyperlipidemia 02/28/2019   Hypertension    Lipoma of abdominal wall    multiple   Low testosterone in male    OSA (obstructive sleep apnea) 09/08/2017   PONV (postoperative nausea and vomiting)    Skin mass 01/20/2017   Sleep apnea    cannot tolerate cpap   Umbilical hernia     Past Surgical History:  Procedure Laterality Date   LIPOMA EXCISION     x2   LIPOMA EXCISION N/A 08/26/2022   Procedure: EXCISION LIPOMA;  Surgeon: Rodolph Romano, MD;  Location: [facility] ORS;  Service: General;  Laterality: N/A;    Prior to Admission medications   Medication Sig Start Date End Date Taking? Authorizing Provider  amphetamine-dextroamphetamine (ADDERALL XR) 30 MG 24 hr capsule Take 30 mg by mouth every morning. 04/27/23   [provider]  clomiPHENE  (CLOMID ) 50 MG  tablet Take 0.5 tablets (25 mg total) by mouth daily. Patient not taking: Reported on 08/22/2022 08/16/22   Francisca Redell BROCKS, MD  clotrimazole-betamethasone (LOTRISONE) lotion Apply topically 2 (two) times daily. 04/03/23   [provider]  escitalopram (LEXAPRO) 10 MG tablet Take 10 mg by mouth daily. 03/28/23   [provider]  gabapentin (NEURONTIN) 100 MG capsule Take 100 mg by mouth at bedtime. 05/23/23   [provider]  HYDROcodone -acetaminophen  (NORCO/VICODIN) 5-325 MG tablet Take 2 tablets by mouth every 8 (eight) hours as needed. 05/22/23   Ward, Josette SAILOR, DO  lidocaine  (XYLOCAINE ) 2 % solution Use as directed 15 mLs in the mouth or throat every 6 (six) hours as needed for mouth pain. 05/22/23   Ward, Josette SAILOR, DO  lisinopril (ZESTRIL) 20 MG tablet Take 1 tablet by mouth every morning. 04/22/20 08/02/23  [provider]  omeprazole (PRILOSEC OTC) 20 MG tablet Take 20 mg by mouth as needed.    [provider]  ondansetron  (ZOFRAN -ODT) 4 MG disintegrating tablet Take 1 tablet (4 mg total) by mouth every 6 (six) hours as needed for nausea or vomiting. 05/22/23   Ward, Josette SAILOR, DO  oxyCODONE -acetaminophen  (PERCOCET) 5-325 MG tablet Take 1 tablet by mouth every 4 (four) hours as needed for severe pain. 08/26/22 08/26/23  Rodolph Romano, MD  pantoprazole  (PROTONIX ) 40 MG tablet Take 1 tablet (40 mg total) by  mouth daily. 05/22/23 07/21/23  Ward, Josette SAILOR, DO  rosuvastatin (CRESTOR) 10 MG tablet Take 1 tablet by mouth every morning. 04/22/20   [provider]  sucralfate (CARAFATE) 1 g tablet Take 1 g by mouth 4 (four) times daily -  with meals and at bedtime. 05/24/23   [provider]  traZODone (DESYREL) 50 MG tablet Take 50 mg by mouth at bedtime.    [provider]    Family History  Problem Relation Age of Onset   Diabetes Mother      Social History   Tobacco Use   Smoking status: Former    Current packs/day: 0.00     Types: Cigarettes    Quit date: 2003    Years since quitting: 22.0    Passive exposure: Never   Smokeless tobacco: Never  Vaping Use   Vaping status: Never Used  Substance Use Topics   Alcohol use: Yes    Comment: socially   Drug use: No    Allergies as of 07/27/2023   (No Known Allergies)    Review of Systems:    All systems reviewed and negative except where noted in HPI.   Physical Exam:  There were no vitals taken for this visit. No LMP for male patient.  General:   Alert,  Well-developed, well-nourished, pleasant and cooperative in NAD Lungs:  Respirations even and unlabored.  Clear throughout to auscultation.   No wheezes, crackles, or rhonchi. No acute distress. Heart:  Regular rate and rhythm; no murmurs, clicks, rubs, or gallops. Abdomen:  Normal bowel sounds.  No bruits.  Soft, and non-distended without masses, hepatosplenomegaly or hernias noted.  No Tenderness.  No guarding or rebound tenderness.    Neurologic:  Alert and oriented x3;  grossly normal neurologically. Psych:  Alert and cooperative. Normal mood and affect.  Imaging Studies: No results found.  Assessment and Plan:   Jalon Squier is a 53 y.o. y/o male has been referred for ***  Follow up ***  Ellouise Console, PA-C    BP check ***

## 2023-07-27 ENCOUNTER — Ambulatory Visit: Payer: 59 | Admitting: Physician Assistant

## 2023-08-01 NOTE — Progress Notes (Signed)
 Chief Complaint  Patient presents with  . Right jaw issues  . bilateral tennis elbow, right side worst   History of Present Illness The patient, with a history of attention deficit disorder and musculoskeletal complaints, presents with persistent right elbow pain. The pain, which began several months ago, is exacerbated by certain movements and lifting objects. Despite a course of Meloxicam, the pain persists.  He also has concerns about his ADHD medications.  Has found that Adderall be beneficial but around 2 PM in the afternoon he will notice that his attention worsens.  In the past has needed immediate release Adderall in the afternoons in addition to the extended release.  In addition to the musculoskeletal concerns, the patient has been experiencing generalized numbness, particularly upon waking. The numbness affects both the upper and lower extremities, predominantly on the right side. The patient also reports a sensation of coldness in the hands. There is no associated neck or back pain, and no history of neck injuries.  Neuropathy type symptoms only appear to be in the forearms down into the hand predominantly in the right arm.  No significant headaches or vision changes.  The patient also reports a decrease in energy levels, which he attributes to a recent reduction in testosterone supplementation. He also mentions a history of acid reflux, for which he has not been taking any medication recently.  Lastly, the patient reports recent dental work and subsequent infection, treated with Amoxicillin  and a 'magic mouthwash.' He has been experiencing discomfort in the right jaw joint, particularly in the mornings and when speaking extensively. The patient denies any hearing loss or changes in vision.     ROS  Review of systems is unremarkable for any active cardiac, respiratory, GI, GU, hematologic, neurologic, dermatologic, HEENT, or psychiatric symptoms except as noted above.  No fevers,  chills, or constitutional symptoms.   Current Outpatient Medications  Medication Sig Dispense Refill  . dextroamphetamine-amphetamine (ADDERALL XR) 30 MG XR capsule Take 1 capsule (30 mg total) by mouth every morning for 90 days 30 capsule 0  . escitalopram oxalate (LEXAPRO) 10 MG tablet TAKE 1 TABLET (10 MG) BY MOUTH EVERY DAY 90 tablet 1  . lisinopriL (ZESTRIL) 20 MG tablet take 1 tablet by mouth twice a day (Patient taking differently: Take 20 mg by mouth once daily In am) 180 tablet 1  . ondansetron  (ZOFRAN -ODT) 4 MG disintegrating tablet Take 4 mg by mouth every 6 (six) hours as needed    . rosuvastatin (CRESTOR) 10 MG tablet Take 1 tablet (10 mg total) by mouth once daily 90 tablet 3  . traZODone (DESYREL) 50 MG tablet Take 1 tablet (50 mg total) by mouth at bedtime (Patient taking differently: Take 50 mg by mouth at bedtime As needed) 30 tablet 3  . dextroamphetamine-amphetamine (ADDERALL) 5 mg tablet Take 1 tablet (5 mg total) by mouth once daily for 30 days 30 tablet 0  . meloxicam (MOBIC) 15 MG tablet Take 1 tablet (15 mg total) by mouth once daily for 21 days 21 tablet 0  . omeprazole (PRILOSEC) 40 MG DR capsule Take 1 capsule (40 mg total) by mouth once daily 30 capsule 11   No current facility-administered medications for this visit.    Allergies as of 08/01/2023  . (No Known Allergies)    Patient Active Problem List  Diagnosis  . HTN (hypertension)  . ALLERGIC RHINITIS  . Back pain  . Chest pain  . Skin masses - multiple  . Chronic insomnia  .  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 PLMS w/o arousal. (SPLIT 09/06/17: Overall AHI 27/hr, NREM AHI 28/hr, Lateral AHI 26/hr)  . Hyperlipidemia  . Achilles tendinitis of left lower extremity    Past Medical History:  Diagnosis Date  . Allergic state   . Anxiety   . Chest pain   . Essential hypertension, benign   . GERD (gastroesophageal reflux disease)   . History of chest pain    abnormal EKG, chest pain,  shortness of breath, hypertension, hyperlipidemia, family history of cardiovascular disease  . History of foot sprain    left midfoot  . Hyperlipidemia   . Hypertension   . Motion sickness   . Other and unspecified angina pectoris (CMS-HCC)   . Plantar fasciitis, left   . PONV (postoperative nausea and vomiting)   . Sleep apnea    Does not use CPAP machine    Past Surgical History:  Procedure Laterality Date  . ROBOTIC ASSISTED UMBILICAL HERNIA REPAIR   08/26/2022   Dr Lucas Catchings  . excision of mass  08/26/2022   Dr Lucas Catchings --- Abd wall  . Removal of lipomas      Vitals:   08/01/23 0939  BP: 120/80  Pulse: 96  SpO2: 99%  Weight: 85.3 kg (188 lb)  Height: 181.6 cm (5' 11.5)   Body mass index is 25.86 kg/m.  Exam BP 120/80   Pulse 96   Ht 181.6 cm (5' 11.5)   Wt 85.3 kg (188 lb)   SpO2 99%   BMI 25.86 kg/m   General. Well appearing; NAD; VS reviewed     Eyes. Sclera and conjunctiva clear; Vision grossly intact; extraocular movements intact Ear: TMs are without erythema or effusion bilaterally.  Ear canal spared of erythema or cerumen. Oropharynx. No suspicious lesions Neck. Supple. No swelling, masses, thyroid normal size, no masses palpated.   Lungs. Respirations unlabored; clear to auscultation bilaterally Cardiovascular. Heart regular rate and rhythm without murmurs, gallops, or rubs Extremities: without edema and with 2+ pulses bilaterally Skin. Normal color and turgor Neurologic. Alert and oriented x3; CN 2-12 grossly intact; no focal deficits  Assessment and Plan  Right tennis elbow Ongoing; referral to orthopedics.  Will represcribe meloxicam and rotate ice.  Neuropathy Ongoing; will repeat labs today.  Will check B12 and folate as well.  We discussed given the general neuropathy will get him neurology for further testing.  Do not believe he is had any stroke or head mass at this time since he is asymptomatic  otherwise.  Gastroesophageal reflux disease without esophagitis Ongoing; will refill omeprazole 40 mg once daily.  Attention deficit hyperactivity disorder (ADHD), unspecified ADHD type Chronic; continue to focus on coping mechanisms and follow-up with therapist.  Will continue with Adderall extended release 30 mg in the morning.  Have given with 5 mg Adderall immediate release to take around 2 PM to see if this will help with the evening episodes.   TMJ inflammation Acute; likely TMJ inflammation from recent dental procedure.  Has finished a round of amoxicillin .  Likely just inflammation.  Will try meloxicam as above and rotate ice to the TMJ joint.    Goals Addressed   None    F/U: Patient follow-up as needed.  Vies him to give me an update in about 2 weeks with the adjustment of Adderall.  JASON HESTLE WHITAKER, PA  Note: This dictation was prepared with Dragon dictation along with smaller phrase technology. Any transcriptional errors that result from this process  are unintentional.

## 2023-08-15 DIAGNOSIS — B182 Chronic viral hepatitis C: Secondary | ICD-10-CM | POA: Insufficient documentation

## 2023-10-10 ENCOUNTER — Other Ambulatory Visit: Payer: Self-pay | Admitting: Neurology

## 2023-10-10 DIAGNOSIS — G379 Demyelinating disease of central nervous system, unspecified: Secondary | ICD-10-CM

## 2023-10-27 ENCOUNTER — Ambulatory Visit
Admission: RE | Admit: 2023-10-27 | Discharge: 2023-10-27 | Disposition: A | Discharge status: Home / Self Care | Source: Ambulatory Visit | Attending: Neurology | Admitting: Neurology

## 2023-10-27 DIAGNOSIS — G379 Demyelinating disease of central nervous system, unspecified: Secondary | ICD-10-CM | POA: Diagnosis present

## 2023-10-27 MED ORDER — GADOBUTROL 1 MMOL/ML IV SOLN
8.0000 mL | Freq: Once | INTRAVENOUS | Status: AC | PRN
  Administered 2023-10-27: 8 mL via INTRAVENOUS

## 2023-11-20 ENCOUNTER — Ambulatory Visit

## 2023-11-21 ENCOUNTER — Telehealth: Payer: Self-pay

## 2023-11-21 ENCOUNTER — Ambulatory Visit

## 2023-11-21 NOTE — Telephone Encounter (Signed)
 Patient evaluation rescheduled from 05/05 to today (05/06). PT called patient to regarding missed evaluation. No connection/VM set up. Per attendance policy, removed pts following appts; pt to call back and schedule one appt at a time.

## 2023-11-21 NOTE — Therapy (Deleted)
 OUTPATIENT PHYSICAL THERAPY NECK EVALUATION   Patient Name: Jay Trujillo MRN: [id number] DOB:1970/12/23, 53 y.o., male Today's Date: 11/21/2023  END OF SESSION:   Past Medical History:  Diagnosis Date   Achilles tendinitis of left lower extremity 08/23/2022   Allergic rhinitis 03/14/2011   Back pain 12/09/2011   Chest pain 07/25/2023   Chronic insomnia 07/25/2017   Sleep walking on ambien. Trazodone started 1/19     GERD (gastroesophageal reflux disease)    HTN (hypertension) 02/10/2011   Hypercholesteremia    Hyperlipidemia 02/28/2019   Hypertension    Lipoma of abdominal wall    multiple   Low testosterone in male    OSA (obstructive sleep apnea) 09/08/2017   PONV (postoperative nausea and vomiting)    Skin mass 01/20/2017   Sleep apnea    cannot tolerate cpap   Umbilical hernia    Past Surgical History:  Procedure Laterality Date   LIPOMA EXCISION     x2   LIPOMA EXCISION N/A 08/26/2022   Procedure: EXCISION LIPOMA;  Surgeon: Eldred Grego, MD;  Location: [facility] ORS;  Service: General;  Laterality: N/A;   There are no active problems to display for this patient.   PCP: Lenell Query, PA-C  REFERRING PROVIDER: Rosan Comfort, MD  REFERRING DIAG: M50.30 (ICD-10-CM) - Other cervical disc degeneration, unspecified cervical region   RATIONALE FOR EVALUATION AND TREATMENT: Rehabilitation  THERAPY DIAG: No diagnosis found.  ONSET DATE: ***  FOLLOW-UP APPT SCHEDULED WITH REFERRING PROVIDER: {yes/no:20286}    SUBJECTIVE:                                                                                                                                                                                         Chief Complaint: ***  Pertinent History:    IMAGING (Per Chart Review 10/27/2023) MRI CERVICAL SPINE WITHOUT AND WITH CONTRAST   TECHNIQUE:  Multiplanar and multiecho pulse sequences of the cervical spine, to  include the  craniocervical junction and cervicothoracic junction,  were obtained without and with intravenous contrast.   CONTRAST:  8mL GADAVIST  GADOBUTROL  1 MMOL/ML IV SOLN   COMPARISON:  Cervical spine radiographs 07/25/2007.   FINDINGS:  Alignment: No significant spondylolisthesis.   Vertebrae: Cervical vertebral body height is maintained. No  significant marrow edema or focal worrisome marrow lesion.   Cord: No signal abnormality identified within the cervical spinal  cord. No pathologic spinal cord enhancement.   Posterior Fossa, vertebral arteries, paraspinal tissues: Posterior  fossa assessed on same-day brain MRI. Flow voids preserved within  the imaged cervical vertebral arteries. No paraspinal mass or  collection.   Disc levels:  Moderate-to-advanced disc degeneration at C5-C6. No more than mild  disc degeneration at the remaining cervical levels.   C2-C3: No significant disc herniation or stenosis.   C3-C4: Slight disc bulge. No significant spinal canal or foraminal  stenosis.   C4-C5: Slight disc bulge. No significant spinal canal or foraminal  stenosis.   C5-C6: Disc bulge with bilateral uncovertebral hypertrophy. The disc  bulge minimally effaces the ventral thecal sac. Bilateral neural  foraminal narrowing (moderate right, mild left). Small ventral  osteophyte also present at this level.   C6-C7: Slight disc bulge. No significant spinal canal or foraminal  stenosis.   C7-T1: No significant disc herniation or stenosis.   IMPRESSION:  1. No lesion is identified within the cervical spinal cord.  2. Cervical spondylosis as outlined within the body of the report.  3. At C5-C6, there is moderate-to-advanced disc degeneration. Disc  bulge. Bilateral uncovertebral hypertrophy. The disc bulge minimally  effaces the ventral thecal sac. Bilateral neural foraminal narrowing  (moderate right, mild left).  4. No significant spinal canal or foraminal stenosis at the   remaining cervical levels.    Electronically Signed    By: Bascom Lily D.O.    On: 10/27/2023 10:04   Pain:  Pain Intensity: Present: /10, Best: /10, Worst: /10 Pain location: *** Pain Quality: {PAIN DESCRIPTION:21022940}  Radiating: {yes/no:20286}  Numbness/Tingling: {yes/no:20286} Focal Weakness: {yes/no:20286} Aggravating factors: *** Relieving factors: *** 24-hour pain behavior: *** History of prior neck injury, pain, surgery, or therapy: {yes/no:20286} Dominant hand: {RIGHT/LEFT:20294} Imaging: {yes/no:20286}  Red flags (personal history of cancer, h/o spinal tumors, history of compression fracture, chills/fever, night sweats, nausea, vomiting, unrelenting pain): Negative  PRECAUTIONS: {Therapy precautions:24002}  WEIGHT BEARING RESTRICTIONS: {Yes ***/No:24003}  FALLS: Has patient fallen in last 6 months? {fallsyesno:27318}  Living Environment Lives with: {[facility] lives with:25569::"lives with their family"} Lives in: {Lives in:25570} Stairs: {opstairs:27293} Has following equipment at home: {Assistive devices:23999}  Prior level of function: {PLOF:24004}  Occupational demands:   Hobbies:   Patient Goals: ***   OBJECTIVE:   Patient Surveys  NDI ***  Cognition Patient is oriented to person, place, and time.  Recent memory is intact.  Remote memory is intact.  Attention span and concentration are intact.  Expressive speech is intact.  Patient's fund of knowledge is within normal limits for educational level.    Gross Musculoskeletal Assessment Tremor: None Bulk: Normal Tone: Normal   Gait   Posture   AROM AROM (Normal range in degrees) AROM  Cervical  Flexion (50)   Extension (80)   Right lateral flexion (45)   Left lateral flexion (45)   Right rotation (85)   Left rotation (85)   (* = pain; Blank rows = not tested)  PROM PROM (Normal range in degrees) PROM  Cervical  Flexion (50)   Extension (80)   Right lateral flexion (45)    Left lateral flexion (45)   Right rotation (85)   Left rotation (85)   (* = pain; Blank rows = not tested)  MMT MMT (out of 5) Right Left  Cervical (isometric)  Flexion WNL  Extension WNL  Lateral Flexion WNL WNL  Rotation WNL WNL      Shoulder   Flexion    Extension    Abduction    Internal rotation    Horizontal abduction    Horizontal adduction    Lower Trapezius    Rhomboids        Elbow  Flexion    Extension  Pronation    Supination        Wrist  Flexion    Extension    Radial deviation    Ulnar deviation        MCP  Flexion    Extension    Abduction    Adduction    (* = pain; Blank rows = not tested)  Sensation Grossly intact to light touch bilateral UE as determined by testing dermatomes C2-T2. Proprioception and hot/cold testing deferred on this date.  Reflexes R/L Elbow: 2+/2+  Brachioradialis: 2+/2+  Tricep: 2+/2+  Palpation Location LEFT  RIGHT           Suboccipitals    Cervical paraspinals    Upper Trapezius    Levator Scapulae    Rhomboid Major/Minor                (Blank rows = not tested) Graded on 0-4 scale (0 = no pain, 1 = pain, 2 = pain with wincing/grimacing/flinching, 3 = pain with withdrawal, 4 = unwilling to allow palpation), (Blank rows = not tested)  Repeated Movements No centralization or peripheralization of symptoms with repeated cervical protraction and retraction.   Passive Accessory Intervertebral Motion Pt denies reproduction of neck pain with CPA C2-T3 and UPA bilaterally C2-T3. Generally, hypomobile throughout   SPECIAL TESTS Spurlings A (ipsilateral lateral flexion/axial compression): R: {NEGATIVE/POSITIVE ZOX:09604} L: {NEGATIVE/POSITIVE VWU:98119} Spurlings B (ipsilateral lateral flexion/contralateral rotation/axial compression): R: {NEGATIVE/POSITIVE JYN:82956} L: {NEGATIVE/POSITIVE OZH:08657} Distraction Test: {NEGATIVE/POSITIVE QIO:96295}  Hoffman Sign (cervical cord compression): R:  {NEGATIVE/POSITIVE MWU:13244} L: {NEGATIVE/POSITIVE WNU:27253} ULTT Median: R: {NEGATIVE/POSITIVE GUY:40347} L: {NEGATIVE/POSITIVE QQV:95638} ULTT Ulnar: R: {NEGATIVE/POSITIVE VFI:43329} L: {NEGATIVE/POSITIVE JJO:84166} ULTT Radial: R: {NEGATIVE/POSITIVE AYT:01601} L: {NEGATIVE/POSITIVE UXN:23557}  Clinical Prediction Rules  Diagnostic Cervical Radiculopathy ULTTa: Any one of the following: A) symptom reproduction; B) side-to-side difference >10 degrees in elbow extension; or C) with regard to involved/painful side: ipsilateral neck lateral flexion decreases symptoms and/or contralateral neck lateral flexion increases symptoms.  ROM: involved-side cervical rotation range of motion less than 60 degrees Distraction test: symptom reduction.  Spurling's A: symptom reproduction.   Number of Positive Criteria  Sensitivity  Specificity  Pos LR  Neg LR   Two  0.39 0.56 0.88 1.09   Three  0.39  0.94  6.1 0.65   Four  0.24 0.99  30.3 0.77   Pos LR = positive likelihood ratio. Neg LR = negative likelihood ratio.    Interventional  Cervical Manipulation 1. Symptom duration of less than 38 days 2. Positive expectation that manipulation will help 3. Side-to-side difference in cervical rotation ROM of 10 or greater 4. Pain with posteroanterior spring testing of the middle cervical spine If at least 3 attributes were present (+LR, 13.5), the probability of experiencing a successful outcome is 90%. IMPLICATIONS: The findi  Cervical Traction patient reported periperalization with lower cervical spine (C4-7) mobility testing,  positive shoulder abduction test,  age > 55,  positive upper limb tension test A,  positive neck distraction test  Number of Positive Criteria  Sensitivity  Specificity  Pos LR  Neg LR  Probability of Success w/ traction + exercise  One 0.07 0.97 1.15 0.21 47.6%  Two  0.3 0.97 1.44 0.40 53.2%  Three 0.63 0.87 4.81 0.42 79.2%  Four  0.3 1.0 23.1 0.71 94.8%  Pos LR =  positive likelihood ratio. Neg LR = negative likelihood ratio.  Having at least 3 out of 5 predictors appears to be the optimal threshold for choosing the cervical  traction as an intervention.    TODAY'S TREATMENT  ***   PATIENT EDUCATION:  Education details: *** Person educated: {Person educated:25204} Education method: {Education Method:25205} Education comprehension: {Education Comprehension:25206}   HOME EXERCISE PROGRAM:    ASSESSMENT:  CLINICAL IMPRESSION: Patient is a *** y.o. *** who was seen today for physical therapy evaluation and treatment for ***.   OBJECTIVE IMPAIRMENTS: {opptimpairments:25111}.   ACTIVITY LIMITATIONS: {activitylimitations:27494}  PARTICIPATION LIMITATIONS: {participationrestrictions:25113}  PERSONAL FACTORS: {Personal factors:25162} are also affecting patient's functional outcome.   REHAB POTENTIAL: {rehabpotential:25112}  CLINICAL DECISION MAKING: {clinical decision making:25114}  EVALUATION COMPLEXITY: {Evaluation complexity:25115}   GOALS: Goals reviewed with patient? {yes/no:20286}  SHORT TERM GOALS: Target date: {follow up:25551}  Pt will be independent with HEP to improve strength and decrease neck pain to improve pain-free function at home and work. Baseline: *** Goal status: INITIAL   LONG TERM GOALS: Target date: {follow up:25551}  Pt will increase   Baseline:  Goal status: INITIAL  2.  Pt will decrease worst neck pain by at least 2 points on the NPRS in order to demonstrate clinically significant reduction in neck pain. Baseline: *** Goal status: INITIAL  3.  Pt will decrease NDI score by at least 19% in order demonstrate clinically significant reduction in neck pain/disability.       Baseline: *** Goal status: INITIAL  4.  *** Baseline: *** Goal status: INITIAL   PLAN: PT FREQUENCY: 1-2x/week  PT DURATION: {rehab duration:25117}  PLANNED INTERVENTIONS: Therapeutic exercises, Therapeutic activity,  Neuromuscular re-education, Balance training, Gait training, Patient/Family education, Self Care, Joint mobilization, Joint manipulation, Vestibular training, Canalith repositioning, Orthotic/Fit training, DME instructions, Dry Needling, Electrical stimulation, Spinal manipulation, Spinal mobilization, Cryotherapy, Moist heat, Taping, Traction, Ultrasound, Ionotophoresis 4mg /ml Dexamethasone , Manual therapy, and Re-evaluation.  PLAN FOR NEXT SESSION: ***   Satira Curet PT, DPT Physical Therapist- [facility]  11/21/2023, 3:25 PM

## 2023-11-22 ENCOUNTER — Ambulatory Visit

## 2023-12-05 ENCOUNTER — Encounter

## 2023-12-06 ENCOUNTER — Encounter

## 2023-12-12 ENCOUNTER — Encounter

## 2023-12-14 ENCOUNTER — Encounter

## 2023-12-20 ENCOUNTER — Encounter

## 2023-12-27 ENCOUNTER — Encounter

## 2024-01-03 ENCOUNTER — Encounter

## 2024-01-08 ENCOUNTER — Encounter

## 2024-01-10 ENCOUNTER — Encounter

## 2024-01-16 ENCOUNTER — Inpatient Hospital Stay: Attending: Oncology | Admitting: Oncology

## 2024-01-16 ENCOUNTER — Inpatient Hospital Stay

## 2024-01-16 ENCOUNTER — Encounter: Payer: Self-pay | Admitting: Oncology

## 2024-01-16 VITALS — BP 142/96 | HR 94 | Temp 97.0°F | Resp 19 | Ht 71.0 in | Wt 197.7 lb

## 2024-01-16 DIAGNOSIS — Z87891 Personal history of nicotine dependence: Secondary | ICD-10-CM | POA: Insufficient documentation

## 2024-01-16 DIAGNOSIS — Z803 Family history of malignant neoplasm of breast: Secondary | ICD-10-CM | POA: Diagnosis not present

## 2024-01-16 DIAGNOSIS — R778 Other specified abnormalities of plasma proteins: Secondary | ICD-10-CM | POA: Diagnosis present

## 2024-01-16 LAB — COMPREHENSIVE METABOLIC PANEL WITH GFR
ALT: 26 U/L (ref 0–44)
AST: 23 U/L (ref 15–41)
Albumin: 3.9 g/dL (ref 3.5–5.0)
Alkaline Phosphatase: 70 U/L (ref 38–126)
Anion gap: 5 (ref 5–15)
BUN: 16 mg/dL (ref 6–20)
CO2: 26 mmol/L (ref 22–32)
Calcium: 8.6 mg/dL — ABNORMAL LOW (ref 8.9–10.3)
Chloride: 101 mmol/L (ref 98–111)
Creatinine, Ser: 0.98 mg/dL (ref 0.61–1.24)
GFR, Estimated: >60 mL/min (ref >60)
Glucose, Bld: 95 mg/dL (ref 70–99)
Potassium: 4.3 mmol/L (ref 3.5–5.1)
Sodium: 132 mmol/L — ABNORMAL LOW (ref 135–145)
Total Bilirubin: 0.7 mg/dL (ref 0.0–1.2)
Total Protein: 7 g/dL (ref 6.5–8.1)

## 2024-01-16 LAB — CBC WITH DIFFERENTIAL/PLATELET
Abs Immature Granulocytes: 0.02 10*3/uL (ref 0.00–0.07)
Basophils Absolute: 0 10*3/uL (ref 0.0–0.1)
Basophils Relative: 1 %
Eosinophils Absolute: 0.2 10*3/uL (ref 0.0–0.5)
Eosinophils Relative: 4 %
HCT: 44.6 % (ref 39.0–52.0)
Hemoglobin: 15.4 g/dL (ref 13.0–17.0)
Immature Granulocytes: 0 %
Lymphocytes Relative: 27 %
Lymphs Abs: 1.4 10*3/uL (ref 0.7–4.0)
MCH: 31.4 pg (ref 26.0–34.0)
MCHC: 34.5 g/dL (ref 30.0–36.0)
MCV: 91 fL (ref 80.0–100.0)
Monocytes Absolute: 0.6 10*3/uL (ref 0.1–1.0)
Monocytes Relative: 12 %
Neutro Abs: 2.9 10*3/uL (ref 1.7–7.7)
Neutrophils Relative %: 56 %
Platelets: 473 10*3/uL — ABNORMAL HIGH (ref 150–400)
RBC: 4.9 MIL/uL (ref 4.22–5.81)
RDW: 12.1 % (ref 11.5–15.5)
WBC: 5.2 10*3/uL (ref 4.0–10.5)
nRBC: 0 % (ref 0.0–0.2)

## 2024-01-16 NOTE — Progress Notes (Signed)
 Hematology/Oncology Consult note [facility] [phone] [phone]  Patient Care Team: Cyrus Selinda Azalee DEVONNA as PCP - General (Family Medicine)   Name of the patient: Jay Trujillo  [id number]  [birth year]    Reason for referral-abnormal SPEP   Referring physician-Dr. Jannett Fairly  Date of visit: 01/16/24   History of presenting illness-patient is a 53 year old male with a past medical history significant for hypertension hyperlipidemia GERD and anxiety who was seen by Dr. Fairly from neurology for evaluation of numbness in his extremities.  As a part of his workup patient underwent SPEP Which incidentally showed IgG kappa M protein of 0.9 g.  Patient has been referred for the same.  Patient admits to being under stress with his day-to-day life but is otherwise very active.  Occasional joint stiffness which is self-limited.  Denies any changes in his appetite or weight.  ECOG PS- 0  Pain scale- 0   Review of systems- Review of Systems  Constitutional:  Positive for malaise/fatigue. Negative for chills, fever and weight loss.  HENT:  Negative for congestion, ear discharge and nosebleeds.   Eyes:  Negative for blurred vision.  Respiratory:  Negative for cough, hemoptysis, sputum production, shortness of breath and wheezing.   Cardiovascular:  Negative for chest pain, palpitations, orthopnea and claudication.  Gastrointestinal:  Negative for abdominal pain, blood in stool, constipation, diarrhea, heartburn, melena, nausea and vomiting.  Genitourinary:  Negative for dysuria, flank pain, frequency, hematuria and urgency.  Musculoskeletal:  Negative for back pain, joint pain and myalgias.  Skin:  Negative for rash.  Neurological:  Negative for dizziness, tingling, focal weakness, seizures, weakness and headaches.  Endo/Heme/Allergies:  Does not bruise/bleed easily.  Psychiatric/Behavioral:  Negative for depression and suicidal ideas. The  patient does not have insomnia.     No Known Allergies  There are no active problems to display for this patient.    Past Medical History:  Diagnosis Date   Achilles tendinitis of left lower extremity 08/23/2022   Allergic rhinitis 03/14/2011   Back pain 12/09/2011   Chest pain 07/25/2023   Chronic insomnia 07/25/2017   Sleep walking on ambien. Trazodone started 1/19     GERD (gastroesophageal reflux disease)    HTN (hypertension) 02/10/2011   Hypercholesteremia    Hyperlipidemia 02/28/2019   Hypertension    Lipoma of abdominal wall    multiple   Low testosterone in male    OSA (obstructive sleep apnea) 09/08/2017   PONV (postoperative nausea and vomiting)    Skin mass 01/20/2017   Sleep apnea    cannot tolerate cpap   Umbilical hernia      Past Surgical History:  Procedure Laterality Date   LIPOMA EXCISION     x2   LIPOMA EXCISION N/A 08/26/2022   Procedure: EXCISION LIPOMA;  Surgeon: Rodolph Romano, MD;  Location: [facility] ORS;  Service: General;  Laterality: N/A;    Social History   Socioeconomic History   Marital status: Married    Spouse name: Not on file   Number of children: Not on file   Years of education: Not on file   Highest education level: Not on file  Occupational History   Not on file  Tobacco Use   Smoking status: Former    Current packs/day: 0.00    Types: Cigarettes    Quit date: 2003    Years since quitting: 22.5    Passive exposure: Never   Smokeless tobacco: Never  Vaping Use   Vaping  status: Never Used  Substance and Sexual Activity   Alcohol use: Yes    Comment: socially   Drug use: No   Sexual activity: Yes  Other Topics Concern   Not on file  Social History Narrative   Not on file   Social Drivers of Health   Financial Resource Strain: Low Risk  (06/13/2023)   Received from [facility]   Overall Financial Resource Strain (CARDIA)    Difficulty of Paying Living Expenses: Not hard at all  Food  Insecurity: No Food Insecurity (01/16/2024)   Hunger Vital Sign    Worried About Running Out of Food in the Last Year: Never true    Ran Out of Food in the Last Year: Never true  Transportation Needs: No Transportation Needs (01/16/2024)   PRAPARE - Administrator, Civil Service (Medical): No    Lack of Transportation (Non-Medical): No  Physical Activity: Not on file  Stress: Not on file  Social Connections: Not on file  Intimate Partner Violence: Not At Risk (01/16/2024)   Humiliation, Afraid, Rape, and Kick questionnaire    Fear of Current or Ex-Partner: No    Emotionally Abused: No    Physically Abused: No    Sexually Abused: No     Family History  Problem Relation Age of Onset   Diabetes Mother    Heart Problems Father    Breast cancer Maternal Grandmother      Current Outpatient Medications:    amphetamine-dextroamphetamine (ADDERALL XR) 30 MG 24 hr capsule, Take 30 mg by mouth every morning., Disp: , Rfl:    escitalopram (LEXAPRO) 10 MG tablet, Take 10 mg by mouth daily., Disp: , Rfl:    lisinopril (ZESTRIL) 20 MG tablet, Take 1 tablet by mouth every morning., Disp: , Rfl:    omeprazole (PRILOSEC OTC) 20 MG tablet, Take 20 mg by mouth as needed., Disp: , Rfl:    ondansetron  (ZOFRAN -ODT) 4 MG disintegrating tablet, Take 1 tablet (4 mg total) by mouth every 6 (six) hours as needed for nausea or vomiting., Disp: 20 tablet, Rfl: 0   rosuvastatin (CRESTOR) 10 MG tablet, Take 1 tablet by mouth every morning., Disp: , Rfl:    clomiPHENE  (CLOMID ) 50 MG tablet, Take 0.5 tablets (25 mg total) by mouth daily. (Patient not taking: Reported on 08/22/2022), Disp: 30 tablet, Rfl: 6   clotrimazole-betamethasone (LOTRISONE) lotion, Apply topically 2 (two) times daily. (Patient not taking: Reported on 01/16/2024), Disp: , Rfl:    gabapentin (NEURONTIN) 100 MG capsule, Take 100 mg by mouth at bedtime. (Patient not taking: Reported on 01/16/2024), Disp: , Rfl:    HYDROcodone -acetaminophen   (NORCO/VICODIN) 5-325 MG tablet, Take 2 tablets by mouth every 8 (eight) hours as needed. (Patient not taking: Reported on 01/16/2024), Disp: 14 tablet, Rfl: 0   lidocaine  (XYLOCAINE ) 2 % solution, Use as directed 15 mLs in the mouth or throat every 6 (six) hours as needed for mouth pain. (Patient not taking: Reported on 01/16/2024), Disp: 300 mL, Rfl: 0   pantoprazole  (PROTONIX ) 40 MG tablet, Take 1 tablet (40 mg total) by mouth daily., Disp: 30 tablet, Rfl: 1   sucralfate (CARAFATE) 1 g tablet, Take 1 g by mouth 4 (four) times daily -  with meals and at bedtime. (Patient not taking: Reported on 01/16/2024), Disp: , Rfl:    traZODone (DESYREL) 50 MG tablet, Take 50 mg by mouth at bedtime. (Patient not taking: Reported on 01/16/2024), Disp: , Rfl:    Physical exam:  Vitals:   01/16/24 1405  BP: (!) 142/96  Pulse: 94  Resp: 19  Temp: (!) 97 F (36.1 C)  TempSrc: Tympanic  SpO2: 98%  Weight: 197 lb 11.2 oz (89.7 kg)  Height: 5' 11 (1.803 m)   Physical Exam  Cardiovascular:     Rate and Rhythm: Normal rate and regular rhythm.     Heart sounds: Normal heart sounds.  Pulmonary:     Effort: Pulmonary effort is normal.     Breath sounds: Normal breath sounds.  Abdominal:     General: Bowel sounds are normal.     Palpations: Abdomen is soft.   Skin:    General: Skin is warm and dry.   Neurological:     Mental Status: He is alert and oriented to person, place, and time.           Latest Ref Rng & Units 01/16/2024    2:46 PM  CMP  Glucose 70 - 99 mg/dL 95   BUN 6 - 20 mg/dL 16   Creatinine 9.38 - 1.24 mg/dL 9.01   Sodium 864 - 854 mmol/L 132   Potassium 3.5 - 5.1 mmol/L 4.3   Chloride 98 - 111 mmol/L 101   CO2 22 - 32 mmol/L 26   Calcium 8.9 - 10.3 mg/dL 8.6   Total Protein 6.5 - 8.1 g/dL 7.0   Total Bilirubin 0.0 - 1.2 mg/dL 0.7   Alkaline Phos 38 - 126 U/L 70   AST 15 - 41 U/L 23   ALT 0 - 44 U/L 26       Latest Ref Rng & Units 01/16/2024    2:46 PM  CBC  WBC 4.0 - 10.5  K/uL 5.2   Hemoglobin 13.0 - 17.0 g/dL 84.5   Hematocrit 60.9 - 52.0 % 44.6   Platelets 150 - 400 K/uL 473      Assessment and plan- Patient is a 53 y.o. male referred for abnormal SPEP  Patient has evidence of M protein 0.9 g IgG kappa.  No evidence of anemia hypercalcemia or AKI.  No B symptoms.  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  Discussed differences between MGUS smoldering multiple myeloma and overt multiple myeloma.  I am checking CBC with differential CMP myeloma panel and serum free light chains today.  I am also checking urine protein electrophoresis.  I will see him back in 6 to 7 weeks time to discuss the results of blood work.  Patient's symptoms of numbness in his extremities is likely unrelated to MGUS which does not require any treatment at this time   Thank you for this kind referral and the opportunity to participate in the care of this patient   Visit Diagnosis 1. Abnormal SPEP     Dr. Annah Skene, MD, MPH [facility] [phone] 01/16/2024

## 2024-01-17 ENCOUNTER — Encounter: Payer: Self-pay | Admitting: Oncology

## 2024-01-17 ENCOUNTER — Telehealth: Payer: Self-pay

## 2024-01-17 LAB — KAPPA/LAMBDA LIGHT CHAINS
Kappa free light chain: 205.2 mg/L — ABNORMAL HIGH (ref 3.3–19.4)
Kappa, lambda light chain ratio: 26.65 — ABNORMAL HIGH (ref 0.26–1.65)
Lambda free light chains: 7.7 mg/L (ref 5.7–26.3)

## 2024-01-17 NOTE — Telephone Encounter (Signed)
 Per Dr. Melanee one of his blood tests is abnormal. can't wait till 8/18 to discuss. I would like to see him in person or video visit on 7/8 at 8.30 am.  flc ratio was high at 26 Guyla Bless- if he asks tell him will discuss further at next visit. Outbound call; informed of above.  Patient verbalized understanding.

## 2024-01-18 LAB — MULTIPLE MYELOMA PANEL, SERUM
Albumin SerPl Elph-Mcnc: 3.6 g/dL (ref 2.9–4.4)
Albumin/Glob SerPl: 1.3 (ref 0.7–1.7)
Alpha 1: 0.2 g/dL (ref 0.0–0.4)
Alpha2 Glob SerPl Elph-Mcnc: 0.6 g/dL (ref 0.4–1.0)
B-Globulin SerPl Elph-Mcnc: 0.9 g/dL (ref 0.7–1.3)
Gamma Glob SerPl Elph-Mcnc: 1.3 g/dL (ref 0.4–1.8)
Globulin, Total: 3 g/dL (ref 2.2–3.9)
IgA: 95 mg/dL (ref 90–386)
IgG (Immunoglobin G), Serum: 1346 mg/dL (ref 603–1613)
IgM (Immunoglobulin M), Srm: 28 mg/dL (ref 20–172)
M Protein SerPl Elph-Mcnc: 0.8 g/dL — ABNORMAL HIGH
Total Protein ELP: 6.6 g/dL (ref 6.0–8.5)

## 2024-01-18 LAB — PROTEIN ELECTRO, RANDOM URINE
Albumin ELP, Urine: 100 %
Alpha-1-Globulin, U: 0 %
Alpha-2-Globulin, U: 0 %
Beta Globulin, U: 0 %
Gamma Globulin, U: 0 %
Total Protein, Urine: 4.9 mg/dL

## 2024-01-23 ENCOUNTER — Inpatient Hospital Stay: Admitting: Oncology

## 2024-01-23 ENCOUNTER — Encounter: Payer: Self-pay | Admitting: Oncology

## 2024-01-23 DIAGNOSIS — Z87891 Personal history of nicotine dependence: Secondary | ICD-10-CM

## 2024-01-23 DIAGNOSIS — D472 Monoclonal gammopathy: Secondary | ICD-10-CM

## 2024-01-23 DIAGNOSIS — R778 Other specified abnormalities of plasma proteins: Secondary | ICD-10-CM | POA: Diagnosis not present

## 2024-01-23 DIAGNOSIS — Z803 Family history of malignant neoplasm of breast: Secondary | ICD-10-CM | POA: Diagnosis not present

## 2024-01-23 NOTE — Progress Notes (Signed)
 I connected with Jay Trujillo on 01/23/24 at  8:30 AM EDT by video enabled telemedicine visit and verified that I am speaking with the correct person using two identifiers.   I discussed the limitations, risks, security and privacy concerns of performing an evaluation and management service by telemedicine and the availability of in-person appointments. I also discussed with the patient that there may be a patient responsible charge related to this service. The patient expressed understanding and agreed to proceed.  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 Maree from neurology for evaluation of numbness in his extremities.  As a part of his workup patient underwent SPEP Which incidentally showed IgG kappa M protein of 0.9 g.  Patient has been referred for the same.  Patient admits to being under stress with his day-to-day life but is otherwise very active.  Occasional joint stiffness which is self-limited.  Denies any changes in his appetite or weight.   Results of myeloma workup from 01/16/2024 were as follows: CBC was normal with an H&H of 15.4/44.6.  CMP was normal with a creatinine of 0.98.  Calcium mildly low at 8.6 with a total protein of 7.  Myeloma panel showed 0.8 g of IgG kappa monoclonal protein.  Serum free kappa light chain elevated at 205 with a free light chain ratio of 26.6.  Random urine protein electrophoresis did not show any evidence of M protein   Interval history no acute issues since his last visit.  Overall he is doing well   Review of Systems  Constitutional:  Negative for chills, fever, malaise/fatigue and weight loss.   HENT:  Negative for congestion, ear discharge and nosebleeds.   Eyes:  Negative for blurred vision.  Respiratory:  Negative for cough, hemoptysis, sputum production, shortness of breath and wheezing.   Cardiovascular:  Negative for chest pain, palpitations, orthopnea and claudication.  Gastrointestinal:  Negative for abdominal pain, blood in stool, constipation, diarrhea, heartburn, melena, nausea and vomiting.  Genitourinary:  Negative for dysuria, flank pain, frequency, hematuria and urgency.  Musculoskeletal:  Negative for back pain, joint pain and myalgias.  Skin:  Negative for rash.  Neurological:  Negative for dizziness, tingling, focal weakness, seizures, weakness and headaches.  Endo/Heme/Allergies:  Does not bruise/bleed easily.  Psychiatric/Behavioral:  Negative for depression and suicidal ideas. The patient does not have insomnia.     No Known Allergies  Past Medical History:  Diagnosis Date   Achilles tendinitis of left lower extremity 08/23/2022   Allergic rhinitis 03/14/2011   Back pain 12/09/2011   Chest pain 07/25/2023   Chronic insomnia 07/25/2017   Sleep walking on ambien. Trazodone started 1/19     GERD (gastroesophageal reflux disease)    HTN (hypertension) 02/10/2011   Hypercholesteremia    Hyperlipidemia 02/28/2019   Hypertension    Lipoma of abdominal wall    multiple   Low testosterone in male    OSA (obstructive sleep apnea) 09/08/2017   PONV (postoperative nausea and vomiting)    Skin mass 01/20/2017   Sleep apnea    cannot tolerate cpap   Umbilical hernia     Past Surgical History:  Procedure Laterality Date   LIPOMA EXCISION  x2   LIPOMA EXCISION N/A 08/26/2022   Procedure: EXCISION LIPOMA;  Surgeon: Rodolph Romano, MD;  Location: [facility] ORS;  Service: General;  Laterality: N/A;    Social History   Socioeconomic History   Marital status: Married    Spouse name: Not on file   Number of children: Not on file   Years of education:  Not on file   Highest education level: Not on file  Occupational History   Not on file  Tobacco Use   Smoking status: Former    Current packs/day: 0.00    Types: Cigarettes    Quit date: 2003    Years since quitting: 22.5    Passive exposure: Never   Smokeless tobacco: Never  Vaping Use   Vaping status: Never Used  Substance and Sexual Activity   Alcohol use: Yes    Comment: socially   Drug use: No   Sexual activity: Yes  Other Topics Concern   Not on file  Social History Narrative   Not on file   Social Drivers of Health   Financial Resource Strain: Low Risk  (06/13/2023)   Received from [facility]   Overall Financial Resource Strain (CARDIA)    Difficulty of Paying Living Expenses: Not hard at all  Food Insecurity: No Food Insecurity (01/16/2024)   Hunger Vital Sign    Worried About Running Out of Food in the Last Year: Never true    Ran Out of Food in the Last Year: Never true  Transportation Needs: No Transportation Needs (01/16/2024)   PRAPARE - Administrator, Civil Service (Medical): No    Lack of Transportation (Non-Medical): No  Physical Activity: Not on file  Stress: Not on file  Social Connections: Not on file  Intimate Partner Violence: Not At Risk (01/16/2024)   Humiliation, Afraid, Rape, and Kick questionnaire    Fear of Current or Ex-Partner: No    Emotionally Abused: No    Physically Abused: No    Sexually Abused: No    Family History  Problem Relation Age of Onset   Diabetes Mother    Heart Problems Father    Breast cancer Maternal Grandmother      Current Outpatient Medications:    amphetamine-dextroamphetamine (ADDERALL XR) 30 MG 24 hr capsule, Take 30 mg by mouth every morning., Disp: , Rfl:    clomiPHENE  (CLOMID ) 50 MG tablet, Take 0.5 tablets (25 mg total) by mouth daily. (Patient not taking: Reported on 08/22/2022), Disp: 30 tablet, Rfl: 6   clotrimazole-betamethasone (LOTRISONE) lotion, Apply topically 2 (two)  times daily. (Patient not taking: Reported on 01/16/2024), Disp: , Rfl:    escitalopram (LEXAPRO) 10 MG tablet, Take 10 mg by mouth daily., Disp: , Rfl:    gabapentin (NEURONTIN) 100 MG capsule, Take 100 mg by mouth at bedtime. (Patient not taking: Reported on 01/16/2024), Disp: , Rfl:    HYDROcodone -acetaminophen  (NORCO/VICODIN) 5-325 MG tablet, Take 2 tablets by mouth every 8 (eight) hours as needed. (Patient not taking: Reported on 01/16/2024), Disp: 14 tablet, Rfl: 0   lidocaine  (XYLOCAINE ) 2 % solution, Use as directed 15 mLs in the mouth or throat every 6 (six) hours as needed for mouth pain. (Patient not taking: Reported on 01/16/2024), Disp: 300 mL, Rfl: 0   lisinopril (ZESTRIL) 20 MG tablet, Take 1 tablet by mouth every morning., Disp: , Rfl:    omeprazole (PRILOSEC OTC) 20 MG tablet, Take 20 mg by mouth as needed., Disp: , Rfl:  ondansetron  (ZOFRAN -ODT) 4 MG disintegrating tablet, Take 1 tablet (4 mg total) by mouth every 6 (six) hours as needed for nausea or vomiting., Disp: 20 tablet, Rfl: 0   pantoprazole  (PROTONIX ) 40 MG tablet, Take 1 tablet (40 mg total) by mouth daily., Disp: 30 tablet, Rfl: 1   rosuvastatin (CRESTOR) 10 MG tablet, Take 1 tablet by mouth every morning., Disp: , Rfl:    sucralfate (CARAFATE) 1 g tablet, Take 1 g by mouth 4 (four) times daily -  with meals and at bedtime. (Patient not taking: Reported on 01/16/2024), Disp: , Rfl:    traZODone (DESYREL) 50 MG tablet, Take 50 mg by mouth at bedtime. (Patient not taking: Reported on 01/16/2024), Disp: , Rfl:   No results found.  No images are attached to the encounter.      Latest Ref Rng & Units 01/16/2024    2:46 PM  CMP  Glucose 70 - 99 mg/dL 95   BUN 6 - 20 mg/dL 16   Creatinine 9.38 - 1.24 mg/dL 9.01   Sodium 864 - 854 mmol/L 132   Potassium 3.5 - 5.1 mmol/L 4.3   Chloride 98 - 111 mmol/L 101   CO2 22 - 32 mmol/L 26   Calcium 8.9 - 10.3 mg/dL 8.6   Total Protein 6.5 - 8.1 g/dL 7.0   Total Bilirubin 0.0 - 1.2 mg/dL  0.7   Alkaline Phos 38 - 126 U/L 70   AST 15 - 41 U/L 23   ALT 0 - 44 U/L 26       Latest Ref Rng & Units 01/16/2024    2:46 PM  CBC  WBC 4.0 - 10.5 K/uL 5.2   Hemoglobin 13.0 - 17.0 g/dL 84.5   Hematocrit 60.9 - 52.0 % 44.6   Platelets 150 - 400 K/uL 473      Observation/objective: Appears in no acute distress over video visit today.  Breathing is nonlabored   Assessment and plan- Patient is a 53 y.o. male referred for abnormal SPEP here to discuss the results of blood work  Discussed the results of blood work from 01/16/2024.  Patient does not have evidence of anemia, AKI or hypercalcemia.  Myeloma panel shows IgG kappa M protein of 0.8 g.  However his free light chain ratio is elevated at 26 (>8).  I would therefore like to get a bone marrow biopsy to see if he falls in the category of MGUS, smoldering multiple myeloma or overt multiple myeloma.  It is unlikely that patient has overt multiple myeloma.  Discussed that bone marrow biopsy is an outpatient procedure usually done under moderate sedation and tolerated well without any significant complications.   I would also like him to get a PET scan if his insurance approves.  If PET scan is not approved by insurance I will plan to get a bone survey.  I will tentatively see the patient back in about 5 weeks time to discuss further management   Follow-up instructions:as above  I discussed the assessment and treatment plan with the patient. The patient was provided an opportunity to ask questions and all were answered. The patient agreed with the plan and demonstrated an understanding of the instructions.   The patient was advised to call back or seek an in-person evaluation if the symptoms worsen or if the condition fails to improve as anticipated.  I provided 30 minutes of face-to-face video visit time during this encounter  and > 50% was spent counseling as documented under my  assessment & plan.  Time spent in lab review as well as  coordinating future tests and appointments.  Visit Diagnosis: 1. Abnormal SPEP     Dr. Annah Skene, MD, MPH [facility] Tel- [phone] 01/23/2024 8:55 AM

## 2024-01-23 NOTE — Progress Notes (Deleted)
 Hematology/Oncology Consult note [facility]  [phone] [phone]  Patient Care Team: Cyrus Selinda Azalee DEVONNA as PCP - General (Family Medicine)   Name of the patient: Jay Trujillo  [id number]  [birth year]   Date of visit: 01/23/24   Interval history- ***  ECOG PS- 0 Pain scale- 0   Review of systems- Review of Systems  Constitutional:  Negative for chills, fever, malaise/fatigue and weight loss.  HENT:  Negative for congestion, ear discharge and nosebleeds.   Eyes:  Negative for blurred vision.  Respiratory:  Negative for cough, hemoptysis, sputum production, shortness of breath and wheezing.   Cardiovascular:  Negative for chest pain, palpitations, orthopnea and claudication.  Gastrointestinal:  Negative for abdominal pain, blood in stool, constipation, diarrhea, heartburn, melena, nausea and vomiting.  Genitourinary:  Negative for dysuria, flank pain, frequency, hematuria and urgency.  Musculoskeletal:  Negative for back pain, joint pain and myalgias.  Skin:  Negative for rash.  Neurological:  Negative for dizziness, tingling, focal weakness, seizures, weakness and headaches.  Endo/Heme/Allergies:  Does not bruise/bleed easily.  Psychiatric/Behavioral:  Negative for depression and suicidal ideas. The patient does not have insomnia.       No Known Allergies   Past Medical History:  Diagnosis Date   Achilles tendinitis of left lower extremity 08/23/2022   Allergic rhinitis 03/14/2011   Back pain 12/09/2011   Chest pain 07/25/2023   Chronic insomnia 07/25/2017   Sleep walking on ambien. Trazodone started 1/19     GERD (gastroesophageal reflux disease)    HTN (hypertension) 02/10/2011   Hypercholesteremia    Hyperlipidemia 02/28/2019   Hypertension    Lipoma of abdominal wall    multiple   Low testosterone in male    OSA (obstructive sleep apnea) 09/08/2017   PONV (postoperative nausea and vomiting)     Skin mass 01/20/2017   Sleep apnea    cannot tolerate cpap   Umbilical hernia      Past Surgical History:  Procedure Laterality Date   LIPOMA EXCISION     x2   LIPOMA EXCISION N/A 08/26/2022   Procedure: EXCISION LIPOMA;  Surgeon: Rodolph Romano, MD;  Location: [facility] ORS;  Service: General;  Laterality: N/A;    Social History   Socioeconomic History   Marital status: Married    Spouse name: Not on file   Number of children: Not on file   Years of education: Not on file   Highest education level: Not on file  Occupational History   Not on file  Tobacco Use   Smoking status: Former    Current packs/day: 0.00    Types: Cigarettes    Quit date: 2003    Years since quitting: 22.5    Passive exposure: Never   Smokeless tobacco: Never  Vaping Use   Vaping status: Never Used  Substance and Sexual Activity   Alcohol use: Yes    Comment: socially   Drug use: No   Sexual activity: Yes  Other Topics Concern   Not on file  Social History Narrative   Not on file   Social Drivers of Health   Financial Resource Strain: Low Risk  (06/13/2023)   Received from [facility]   Overall Financial Resource Strain (CARDIA)    Difficulty of Paying Living Expenses: Not hard at all  Food Insecurity: No Food Insecurity (01/16/2024)   Hunger Vital Sign    Worried About Running Out of Food in the Last Year: Never  true    Ran Out of Food in the Last Year: Never true  Transportation Needs: No Transportation Needs (01/16/2024)   PRAPARE - Administrator, Civil Service (Medical): No    Lack of Transportation (Non-Medical): No  Physical Activity: Not on file  Stress: Not on file  Social Connections: Not on file  Intimate Partner Violence: Not At Risk (01/16/2024)   Humiliation, Afraid, Rape, and Kick questionnaire    Fear of Current or Ex-Partner: No    Emotionally Abused: No    Physically Abused: No    Sexually Abused: No    Family History  Problem  Relation Age of Onset   Diabetes Mother    Heart Problems Father    Breast cancer Maternal Grandmother      Current Outpatient Medications:    amphetamine-dextroamphetamine (ADDERALL XR) 30 MG 24 hr capsule, Take 30 mg by mouth every morning., Disp: , Rfl:    clomiPHENE  (CLOMID ) 50 MG tablet, Take 0.5 tablets (25 mg total) by mouth daily. (Patient not taking: Reported on 08/22/2022), Disp: 30 tablet, Rfl: 6   clotrimazole-betamethasone (LOTRISONE) lotion, Apply topically 2 (two) times daily. (Patient not taking: Reported on 01/16/2024), Disp: , Rfl:    escitalopram (LEXAPRO) 10 MG tablet, Take 10 mg by mouth daily., Disp: , Rfl:    gabapentin (NEURONTIN) 100 MG capsule, Take 100 mg by mouth at bedtime. (Patient not taking: Reported on 01/16/2024), Disp: , Rfl:    HYDROcodone -acetaminophen  (NORCO/VICODIN) 5-325 MG tablet, Take 2 tablets by mouth every 8 (eight) hours as needed. (Patient not taking: Reported on 01/16/2024), Disp: 14 tablet, Rfl: 0   lidocaine  (XYLOCAINE ) 2 % solution, Use as directed 15 mLs in the mouth or throat every 6 (six) hours as needed for mouth pain. (Patient not taking: Reported on 01/16/2024), Disp: 300 mL, Rfl: 0   lisinopril (ZESTRIL) 20 MG tablet, Take 1 tablet by mouth every morning., Disp: , Rfl:    omeprazole (PRILOSEC OTC) 20 MG tablet, Take 20 mg by mouth as needed., Disp: , Rfl:    ondansetron  (ZOFRAN -ODT) 4 MG disintegrating tablet, Take 1 tablet (4 mg total) by mouth every 6 (six) hours as needed for nausea or vomiting., Disp: 20 tablet, Rfl: 0   pantoprazole  (PROTONIX ) 40 MG tablet, Take 1 tablet (40 mg total) by mouth daily., Disp: 30 tablet, Rfl: 1   rosuvastatin (CRESTOR) 10 MG tablet, Take 1 tablet by mouth every morning., Disp: , Rfl:    sucralfate (CARAFATE) 1 g tablet, Take 1 g by mouth 4 (four) times daily -  with meals and at bedtime. (Patient not taking: Reported on 01/16/2024), Disp: , Rfl:    traZODone (DESYREL) 50 MG tablet, Take 50 mg by mouth at bedtime.  (Patient not taking: Reported on 01/16/2024), Disp: , Rfl:   Physical exam: There were no vitals filed for this visit. Physical Exam Cardiovascular:     Rate and Rhythm: Normal rate and regular rhythm.     Heart sounds: Normal heart sounds.  Pulmonary:     Effort: Pulmonary effort is normal.     Breath sounds: Normal breath sounds.  Abdominal:     General: Bowel sounds are normal.     Palpations: Abdomen is soft.  Skin:    General: Skin is warm and dry.  Neurological:     Mental Status: He is alert and oriented to person, place, and time.      I have personally reviewed labs listed below:    Latest  Ref Rng & Units 01/16/2024    2:46 PM  CMP  Glucose 70 - 99 mg/dL 95   BUN 6 - 20 mg/dL 16   Creatinine 9.38 - 1.24 mg/dL 9.01   Sodium 864 - 854 mmol/L 132   Potassium 3.5 - 5.1 mmol/L 4.3   Chloride 98 - 111 mmol/L 101   CO2 22 - 32 mmol/L 26   Calcium 8.9 - 10.3 mg/dL 8.6   Total Protein 6.5 - 8.1 g/dL 7.0   Total Bilirubin 0.0 - 1.2 mg/dL 0.7   Alkaline Phos 38 - 126 U/L 70   AST 15 - 41 U/L 23   ALT 0 - 44 U/L 26       Latest Ref Rng & Units 01/16/2024    2:46 PM  CBC  WBC 4.0 - 10.5 K/uL 5.2   Hemoglobin 13.0 - 17.0 g/dL 84.5   Hematocrit 60.9 - 52.0 % 44.6   Platelets 150 - 400 K/uL 473      Visit Diagnosis 1. Abnormal SPEP      Dr. Annah Skene, MD, MPH [facility] [phone] 01/23/2024 7:54 AM

## 2024-01-23 NOTE — Progress Notes (Signed)
 Pt for follow up, requested changed to virtual visit instead of in person.

## 2024-01-24 ENCOUNTER — Telehealth: Payer: Self-pay | Admitting: *Deleted

## 2024-01-24 DIAGNOSIS — D472 Monoclonal gammopathy: Secondary | ICD-10-CM

## 2024-01-24 DIAGNOSIS — Z789 Other specified health status: Secondary | ICD-10-CM

## 2024-01-24 NOTE — Telephone Encounter (Signed)
 Patient scheduled to for bone marrow biopsy on  Friday 7/18 at 8:30a an arrive at 7:30a. I called patient to provide him with information. Educated patient to be NPO after midnight prior to procedure and I also instructed him to bring a driver for this apt since he would be giving sedation. Pt informed to go to the Heart/Vascular location for check in at [facility].  Patient then proceeded to tell me that he is having panic attacks and high levels of anxiety. Pt is requesting xanax. He is under care of his psychiatrist/therapist.  I spoke with Tinnie, NP. Patient needs to contact his therapist for further discussion and recommendations for antianxiety med mgmt.

## 2024-01-24 NOTE — Telephone Encounter (Signed)
 Pt made aware that he needs to contact his therapist to discuss his anxiety and med mgmt.

## 2024-01-25 ENCOUNTER — Inpatient Hospital Stay

## 2024-01-25 NOTE — Progress Notes (Signed)
 [facility] Clinical Social Work  Initial Assessment   Jay Trujillo is a 53 y.o. year old male contacted by phone. Clinical Social Work was referred by nurse for assessment of psychosocial needs.   SDOH (Social Determinants of Health) assessments performed: Yes   SDOH Screenings   Food Insecurity: No Food Insecurity (01/16/2024)  Housing: Low Risk  (01/16/2024)  Transportation Needs: No Transportation Needs (01/16/2024)  Utilities: Not At Risk (01/16/2024)  Depression (PHQ2-9): Low Risk  (01/16/2024)  Financial Resource Strain: Low Risk  (06/13/2023)   Received from [facility]  Tobacco Use: Medium Risk (01/23/2024)     Distress Screen completed: No    01/16/2024    2:00 PM  ONCBCN DISTRESS SCREENING  Screening Type Initial Screening  How much distress have you been experiencing in the past week? (0-10) 0      Family/Social Information:  Housing Arrangement: patient lives with his wife. Family members/support persons in your life? Family.  Patient's daughter just graduated from [employer]. Transportation concerns: no  Employment: Working full time  Income source: Educational psychologist concerns: No Type of concern: None Food access concerns: no Religious or spiritual practice: Not known Advanced directives: No Services Currently in place:  [employer]  Coping/ Adjustment to diagnosis: Patient understands treatment plan and what happens next? yes Concerns about diagnosis and/or treatment: Fear of the unknown. Patient reported stressors: Anxiety/ nervousness Hopes and/or priorities: Mental Health Patient enjoys time with family/ friends Current coping skills/ strengths: Capable of independent living , Manufacturing systems engineer , Contractor , General fund of knowledge , Supportive family/friends , and Work skills     SUMMARY: Current SDOH Barriers:  Mental Health Concerns   Clinical Social Work Clinical Goal(s):  No clinical social work goals at this  time  Interventions: Discussed common feeling and emotions when being diagnosed with cancer, and the importance of support during treatment Informed patient of the support team roles and support services at [facility] Provided CSW contact information and encouraged patient to call with any questions or concerns Referred patient to community resources: Psychiatrist.  Patient requested xanax and is aware CSW is unable to assist.  He does have a therapist, but stated nothing helps other than the xanax.   Follow Up Plan: Patient will contact CSW with any support or resource needs Patient verbalizes understanding of plan: Yes    Jay CHRISTELLA Au, LCSW Clinical Social Worker [facility]

## 2024-02-02 ENCOUNTER — Ambulatory Visit: Admitting: Radiology

## 2024-02-02 DIAGNOSIS — M25562 Pain in left knee: Secondary | ICD-10-CM | POA: Insufficient documentation

## 2024-02-05 ENCOUNTER — Other Ambulatory Visit: Payer: Self-pay | Admitting: Hospice and Palliative Medicine

## 2024-02-05 ENCOUNTER — Other Ambulatory Visit: Payer: Self-pay | Admitting: Radiology

## 2024-02-05 ENCOUNTER — Inpatient Hospital Stay (HOSPITAL_BASED_OUTPATIENT_CLINIC_OR_DEPARTMENT_OTHER): Admitting: Hospice and Palliative Medicine

## 2024-02-05 ENCOUNTER — Ambulatory Visit
Admission: RE | Admit: 2024-02-05 | Discharge: 2024-02-05 | Disposition: A | Discharge status: Home / Self Care | Source: Ambulatory Visit | Attending: Oncology | Admitting: Oncology

## 2024-02-05 ENCOUNTER — Telehealth: Payer: Self-pay | Admitting: *Deleted

## 2024-02-05 DIAGNOSIS — D472 Monoclonal gammopathy: Secondary | ICD-10-CM

## 2024-02-05 DIAGNOSIS — F419 Anxiety disorder, unspecified: Secondary | ICD-10-CM

## 2024-02-05 DIAGNOSIS — R778 Other specified abnormalities of plasma proteins: Secondary | ICD-10-CM

## 2024-02-05 LAB — GLUCOSE, CAPILLARY: Glucose-Capillary: 108 mg/dL — ABNORMAL HIGH (ref 70–99)

## 2024-02-05 MED ORDER — FLUDEOXYGLUCOSE F - 18 (FDG) INJECTION
10.7500 | Freq: Once | INTRAVENOUS | Status: AC | PRN
  Administered 2024-02-05: 10.75 via INTRAVENOUS

## 2024-02-05 MED ORDER — DIAZEPAM 5 MG PO TABS: ORAL_TABLET | ORAL | 0 refills | Status: DC

## 2024-02-05 NOTE — Progress Notes (Signed)
 Patient for IR Bone Marrow Biopsy on Tues 02/06/24, I called and spoke with the patient on the phone and gave pre-procedure instructions. Pt was made aware to be here at 8:30a, NPO after MN prior to procedure as well as driver post procedure/recovery/discharge. Pt stated understanding.  Called 02/05/24

## 2024-02-05 NOTE — H&P (Signed)
 Chief Complaint: Patient was seen in consultation today for monoclonal gammopathy of unknown significance (MUGS), with consideration for bone marrow biopsy.  Referring Provider(s): Dr. Annah Skene, MD   Supervising Physician: Jenna Hacker  Patient Status: [facility] - Out-pt  Patient is Full Code  History of Present Illness: Aristides Luckey is a 53 y.o. male  with PMHx notable for HTN, HLD, OSA, and others as delineated below.  Per Dr. Darold progress note on 7/8: [Patient]  was seen by Dr. Maree from neurology for evaluation of numbness in his extremities.  As a part of his workup patient underwent SPEP Which incidentally showed IgG kappa M protein of 0.9 g.  Patient has been referred for the same.  Patient admits to being under stress with his day-to-day life but is otherwise very active.  Occasional joint stiffness which is self-limited.  Denies any changes in his appetite or weight.    Results of myeloma workup from 01/16/2024 were as follows: CBC was normal with an H&H of 15.4/44.6.  CMP was normal with a creatinine of 0.98.  Calcium mildly low at 8.6 with a total protein of 7.  Myeloma panel showed 0.8 g of IgG kappa monoclonal protein.  Serum free kappa light chain elevated at 205 with a free light chain ratio of 26.6.  Random urine protein electrophoresis did not show any evidence of M protein   Interventional Radiology was requested for bone marrow biopsy and aspiration. Patient is scheduled for same in IR today.   Patient is alert and laying in bed, calm. Wife is at bedside. Patient is currently without any significant complaints.  Patient denies any fevers, headache, chest pain, SOB, cough, abdominal pain, nausea, vomiting or bleeding.     Past Medical History:  Diagnosis Date   Achilles tendinitis of left lower extremity 08/23/2022   Allergic rhinitis 03/14/2011   Back pain 12/09/2011   Chest pain 07/25/2023   Chronic insomnia 07/25/2017   Sleep walking on  ambien. Trazodone started 1/19     GERD (gastroesophageal reflux disease)    HTN (hypertension) 02/10/2011   Hypercholesteremia    Hyperlipidemia 02/28/2019   Hypertension    Lipoma of abdominal wall    multiple   Low testosterone in male    OSA (obstructive sleep apnea) 09/08/2017   PONV (postoperative nausea and vomiting)    Skin mass 01/20/2017   Sleep apnea    cannot tolerate cpap   Umbilical hernia     Past Surgical History:  Procedure Laterality Date   LIPOMA EXCISION     x2   LIPOMA EXCISION N/A 08/26/2022   Procedure: EXCISION LIPOMA;  Surgeon: Rodolph Romano, MD;  Location: [facility] ORS;  Service: General;  Laterality: N/A;    Allergies: Patient has no known allergies.  Medications: Prior to Admission medications   Medication Sig Start Date End Date Taking? Authorizing Provider  amphetamine-dextroamphetamine (ADDERALL XR) 30 MG 24 hr capsule Take 30 mg by mouth every morning. 04/27/23   [provider]  clomiPHENE  (CLOMID ) 50 MG tablet Take 0.5 tablets (25 mg total) by mouth daily. Patient not taking: Reported on 01/23/2024 08/16/22   Francisca Redell BROCKS, MD  clotrimazole-betamethasone (LOTRISONE) lotion Apply topically 2 (two) times daily. Patient not taking: Reported on 01/23/2024 04/03/23   [provider]  diazepam  (VALIUM ) 5 MG tablet Take 0.5-1 tablet 30 to 60 minutes before PET scan 02/05/24   Borders, Fonda SAUNDERS, NP  escitalopram (LEXAPRO) 10 MG tablet Take 20 mg by  mouth daily. 03/28/23   [provider]  gabapentin (NEURONTIN) 100 MG capsule Take 100 mg by mouth at bedtime. Patient not taking: Reported on 01/23/2024 05/23/23   [provider]  HYDROcodone -acetaminophen  (NORCO/VICODIN) 5-325 MG tablet Take 2 tablets by mouth every 8 (eight) hours as needed. Patient not taking: Reported on 01/23/2024 05/22/23   Ward, Josette SAILOR, DO  lidocaine  (XYLOCAINE ) 2 % solution Use as directed 15 mLs in the mouth or throat every 6 (six) hours as needed  for mouth pain. Patient not taking: Reported on 01/23/2024 05/22/23   Ward, Josette SAILOR, DO  lisinopril (ZESTRIL) 20 MG tablet Take 1 tablet by mouth every morning. 04/22/20 01/23/24  [provider]  omeprazole (PRILOSEC OTC) 20 MG tablet Take 20 mg by mouth as needed.    [provider]  ondansetron  (ZOFRAN -ODT) 4 MG disintegrating tablet Take 1 tablet (4 mg total) by mouth every 6 (six) hours as needed for nausea or vomiting. 05/22/23   Ward, Josette SAILOR, DO  pantoprazole  (PROTONIX ) 40 MG tablet Take 1 tablet (40 mg total) by mouth daily. Patient not taking: Reported on 01/23/2024 05/22/23 01/16/24  Ward, Josette SAILOR, DO  rosuvastatin (CRESTOR) 10 MG tablet Take 1 tablet by mouth every morning. 04/22/20   [provider]  sucralfate (CARAFATE) 1 g tablet Take 1 g by mouth 4 (four) times daily -  with meals and at bedtime. Patient not taking: Reported on 01/23/2024 05/24/23   [provider]  traZODone (DESYREL) 50 MG tablet Take 50 mg by mouth at bedtime. Patient not taking: Reported on 01/23/2024    [provider]     Family History  Problem Relation Age of Onset   Diabetes Mother    Heart Problems Father    Breast cancer Maternal Grandmother     Social History   Socioeconomic History   Marital status: Married    Spouse name: Not on file   Number of children: Not on file   Years of education: Not on file   Highest education level: Not on file  Occupational History   Not on file  Tobacco Use   Smoking status: Former    Current packs/day: 0.00    Types: Cigarettes    Quit date: 2003    Years since quitting: 22.5    Passive exposure: Never   Smokeless tobacco: Never  Vaping Use   Vaping status: Never Used  Substance and Sexual Activity   Alcohol use: Yes    Comment: socially   Drug use: No   Sexual activity: Yes  Other Topics Concern   Not on file  Social History Narrative   Not on file   Social Drivers of Health   Financial Resource Strain:  Low Risk  (06/13/2023)   Received from [facility]   Overall Financial Resource Strain (CARDIA)    Difficulty of Paying Living Expenses: Not hard at all  Food Insecurity: No Food Insecurity (01/16/2024)   Hunger Vital Sign    Worried About Running Out of Food in the Last Year: Never true    Ran Out of Food in the Last Year: Never true  Transportation Needs: No Transportation Needs (01/16/2024)   PRAPARE - Administrator, Civil Service (Medical): No    Lack of Transportation (Non-Medical): No  Physical Activity: Not on file  Stress: Not on file  Social Connections: Not on file     Review of Systems: A 12 point ROS discussed and pertinent  positives are indicated in the HPI above.  All other systems are negative.  Vital Signs: There were no vitals taken for this visit.  Advance Care Plan: The advanced care place/surrogate decision maker was discussed at the time of visit and the patient did not wish to discuss or was not able to name a surrogate decision maker or provide an advance care plan.  Physical Exam Vitals reviewed.  Constitutional:      Appearance: Normal appearance.  HENT:     Mouth/Throat:     Mouth: Mucous membranes are dry.  Cardiovascular:     Rate and Rhythm: Normal rate and regular rhythm.     Pulses: Normal pulses.     Heart sounds: Normal heart sounds.  Pulmonary:     Effort: Pulmonary effort is normal.     Breath sounds: Normal breath sounds.  Abdominal:     General: Abdomen is flat.     Tenderness: There is no abdominal tenderness.  Musculoskeletal:        General: Normal range of motion.     Cervical back: Normal range of motion.  Skin:    General: Skin is warm and dry.  Neurological:     Mental Status: He is alert and oriented to person, place, and time.  Psychiatric:        Mood and Affect: Mood normal.        Behavior: Behavior normal.        Thought Content: Thought content normal.        Judgment: Judgment normal.      Imaging: No results found.  Labs:  CBC: Recent Labs    05/21/23 1950 01/16/24 1446  WBC 11.3* 5.2  HGB 18.0* 15.4  HCT 53.0* 44.6  PLT 555* 473*    COAGS: No results for input(s): INR, APTT in the last 8760 hours.  BMP: Recent Labs    05/21/23 1950 01/16/24 1446  NA 133* 132*  K 4.0 4.3  CL 98 101  CO2 27 26  GLUCOSE 95 95  BUN 13 16  CALCIUM 9.7 8.6*  CREATININE 1.20 0.98  GFRNONAA >60 >60    LIVER FUNCTION TESTS: Recent Labs    05/21/23 1950 01/16/24 1446  BILITOT 1.1 0.7  AST 29 23  ALT 33 26  ALKPHOS 59 70  PROT 7.8 7.0  ALBUMIN 4.2 3.9    TUMOR MARKERS: No results for input(s): AFPTM, CEA, CA199, CHROMGRNA in the last 8760 hours.  Assessment and Plan: Per Dr. Pink' progress note on 7/8: Discussed the results of blood work from 01/16/2024.  Patient does not have evidence of anemia, AKI or hypercalcemia.  Myeloma panel shows IgG kappa M protein of 0.8 g.  However his free light chain ratio is elevated at 26 (>8).  I would therefore like to get a bone marrow biopsy to see if he falls in the category of MGUS, smoldering multiple myeloma or overt multiple myeloma.  It is unlikely that patient has overt multiple myeloma.  Patient presents for scheduled bone marrow biopsy and aspiration in IR today.  Patient has been NPO since midnight.  All labs and medications are within acceptable parameters.  No pertinent allergies.   Risks and benefits of bone marrow biopsy and aspiration was discussed with the patient and/or patient's family including, but not limited to bleeding, infection, damage to adjacent structures or low yield requiring additional tests.  All of the questions were answered and there is agreement to proceed.  Consent signed and in  chart.    Thank you for allowing our service to participate in Kyran Connaughton 's care.  Electronically Signed: Carlin DELENA Griffon, PA-C   02/05/2024, 10:13 PM      I spent a  total of 40 Minutes in face to face in clinical consultation, greater than 50% of which was counseling/coordinating care for monoclonal gammopathy of unknown significance (MUGS), with consideration for bone marrow biopsy.

## 2024-02-05 NOTE — Progress Notes (Signed)
 Virtual Visit via Telephone Note  I connected with Jay Trujillo on 02/05/24 at 11:30 AM EDT by telephone and verified that I am speaking with the correct person using two identifiers.  Location: Patient: Home Provider: Clinic   I discussed the limitations, risks, security and privacy concerns of performing an evaluation and management service by telephone and the availability of in person appointments. I also discussed with the patient that there may be a patient responsible charge related to this service. The patient expressed understanding and agreed to proceed.   History of Present Illness: Jay Trujillo is a 53 year old man with multiple medical problems including history of anxiety who is being followed by Dr. Melanee for possible IgG kappa MGUS.   Observations/Objective: Patient was an add-on telephone visit today at Dr. Damaris request (covering for Dr. Melanee).  Patient was scheduled for PET scan but was unable to complete imaging due to acute anxiety/panic attack.  I called and spoke with patient by phone.  Patient endorses recent worsening of anxiety.  He has been on Lexapro for several months at 10 mg daily.  Reports episodes of panic attacks.  Has history of taking alprazolam but has not been on any recently.  No SI/HI. Denies depression but is worried about diagnosis and workup for possible cancer. Patient is interested in referral to psychiatry.  Discussed ordering diazepam  to take prior to PET scan.  Assessment and Plan: Anxiety - recommended increasing Lexapro to 20mg  daily. Referral to psychiatry/[facility]. Diazepam  5mg  30-60 minutes before PET scan. PDMP reviewed.   Follow Up Instructions: As needed   I discussed the assessment and treatment plan with the patient. The patient was provided an opportunity to ask questions and all were answered. The patient agreed with the plan and demonstrated an understanding of the instructions.   The patient was advised to  call back or seek an in-person evaluation if the symptoms worsen or if the condition fails to improve as anticipated.  I provided 10 minutes of non-face-to-face time during this encounter.   FONDA JONELLE MOWER, NP

## 2024-02-06 ENCOUNTER — Other Ambulatory Visit: Payer: Self-pay

## 2024-02-06 ENCOUNTER — Encounter: Payer: Self-pay | Admitting: Radiology

## 2024-02-06 ENCOUNTER — Ambulatory Visit
Admission: RE | Admit: 2024-02-06 | Discharge: 2024-02-06 | Disposition: A | Discharge status: Home / Self Care | Source: Ambulatory Visit | Attending: Oncology | Admitting: Oncology

## 2024-02-06 DIAGNOSIS — Z87891 Personal history of nicotine dependence: Secondary | ICD-10-CM | POA: Insufficient documentation

## 2024-02-06 DIAGNOSIS — D472 Monoclonal gammopathy: Secondary | ICD-10-CM | POA: Diagnosis present

## 2024-02-06 DIAGNOSIS — R778 Other specified abnormalities of plasma proteins: Secondary | ICD-10-CM

## 2024-02-06 HISTORY — PX: IR BONE MARROW BIOPSY & ASPIRATION: IMG5727

## 2024-02-06 LAB — CBC WITH DIFFERENTIAL/PLATELET
Abs Immature Granulocytes: 0.01 K/uL (ref 0.00–0.07)
Basophils Absolute: 0.1 K/uL (ref 0.0–0.1)
Basophils Relative: 1 %
Eosinophils Absolute: 0.1 K/uL (ref 0.0–0.5)
Eosinophils Relative: 3 %
HCT: 48.3 % (ref 39.0–52.0)
Hemoglobin: 15.8 g/dL (ref 13.0–17.0)
Immature Granulocytes: 0 %
Lymphocytes Relative: 36 %
Lymphs Abs: 1.4 K/uL (ref 0.7–4.0)
MCH: 30.3 pg (ref 26.0–34.0)
MCHC: 32.7 g/dL (ref 30.0–36.0)
MCV: 92.7 fL (ref 80.0–100.0)
Monocytes Absolute: 0.5 K/uL (ref 0.1–1.0)
Monocytes Relative: 13 %
Neutro Abs: 1.9 K/uL (ref 1.7–7.7)
Neutrophils Relative %: 47 %
Platelets: 536 K/uL — ABNORMAL HIGH (ref 150–400)
RBC: 5.21 MIL/uL (ref 4.22–5.81)
RDW: 11.7 % (ref 11.5–15.5)
WBC: 4 K/uL (ref 4.0–10.5)
nRBC: 0 % (ref 0.0–0.2)

## 2024-02-06 MED ORDER — FENTANYL CITRATE (PF) 100 MCG/2ML IJ SOLN
INTRAMUSCULAR | Status: AC
  Filled 2024-02-06: qty 2

## 2024-02-06 MED ORDER — FENTANYL CITRATE (PF) 100 MCG/2ML IJ SOLN
INTRAMUSCULAR | Status: AC | PRN
  Administered 2024-02-06: 25 ug via INTRAVENOUS
  Administered 2024-02-06: 50 ug via INTRAVENOUS
  Administered 2024-02-06: 25 ug via INTRAVENOUS

## 2024-02-06 MED ORDER — SODIUM CHLORIDE 0.9 % IV SOLN: INTRAVENOUS | Status: DC

## 2024-02-06 MED ORDER — MIDAZOLAM HCL 2 MG/2ML IJ SOLN
INTRAMUSCULAR | Status: AC
  Filled 2024-02-06: qty 2

## 2024-02-06 MED ORDER — LIDOCAINE 1 % OPTIME INJ - NO CHARGE
10.0000 mL | Freq: Once | INTRAMUSCULAR | Status: AC
  Administered 2024-02-06: 10 mL via INTRADERMAL
  Filled 2024-02-06: qty 10

## 2024-02-06 MED ORDER — ONDANSETRON HCL 4 MG/2ML IJ SOLN
INTRAMUSCULAR | Status: AC
  Filled 2024-02-06: qty 2

## 2024-02-06 MED ORDER — ONDANSETRON HCL 4 MG/2ML IJ SOLN
INTRAMUSCULAR | Status: AC | PRN
Start: 2024-02-06 — End: 2024-02-06
  Administered 2024-02-06: 4 mg via INTRAVENOUS

## 2024-02-06 MED ORDER — MIDAZOLAM HCL 2 MG/2ML IJ SOLN
INTRAMUSCULAR | Status: AC | PRN
Start: 2024-02-06 — End: 2024-02-06
  Administered 2024-02-06 (×2): .5 mg via INTRAVENOUS
  Administered 2024-02-06: 1 mg via INTRAVENOUS

## 2024-02-06 MED ORDER — HEPARIN SOD (PORK) LOCK FLUSH 100 UNIT/ML IV SOLN
INTRAVENOUS | Status: AC
  Filled 2024-02-06: qty 5

## 2024-02-06 NOTE — Progress Notes (Signed)
 The patient enrolled in [facility] services on 02/06/2024.  Initial behavioral health evaluation is scheduled for 02/12/2024.

## 2024-02-06 NOTE — Procedures (Signed)
 Pre procedural Dx: monoclonal gammopathy of unknown significance (MUGS)  Post procedural Dx: Same  Technically successful fluoro guided biopsy of left iliac bone   EBL: None.   Complications: None immediate.   KANDICE Banner, MD Pager #: [phone]

## 2024-02-08 ENCOUNTER — Telehealth: Payer: Self-pay

## 2024-02-08 LAB — SURGICAL PATHOLOGY

## 2024-02-08 NOTE — Telephone Encounter (Signed)
 BM BX completed 02/06/24.  Per Dr. Melanee add Fish for myeloma and cytogenetics.  Spoke to Natalie and it's being added on.

## 2024-02-08 NOTE — Telephone Encounter (Signed)
-----   Message from Annah JAYSON Skene sent at 02/08/2024  8:52 AM EDT ----- Regarding: RE: Fish for myeloma and cytogenetics ----- Message ----- From: Faustino Bence, RN Sent: 02/08/2024   8:19 AM EDT To: Annah JAYSON Skene, MD; Bence Faustino, RN  Dr. Skene would you like a NGS or FISH added to the bm bx?

## 2024-02-09 ENCOUNTER — Ambulatory Visit
Admission: RE | Admit: 2024-02-09 | Discharge: 2024-02-09 | Disposition: A | Discharge status: Home / Self Care | Source: Ambulatory Visit | Attending: Oncology | Admitting: Oncology

## 2024-02-09 DIAGNOSIS — D472 Monoclonal gammopathy: Secondary | ICD-10-CM | POA: Diagnosis present

## 2024-02-09 DIAGNOSIS — R918 Other nonspecific abnormal finding of lung field: Secondary | ICD-10-CM | POA: Insufficient documentation

## 2024-02-09 DIAGNOSIS — R778 Other specified abnormalities of plasma proteins: Secondary | ICD-10-CM | POA: Insufficient documentation

## 2024-02-09 LAB — GLUCOSE, CAPILLARY: Glucose-Capillary: 118 mg/dL — ABNORMAL HIGH (ref 70–99)

## 2024-02-09 MED ORDER — FLUDEOXYGLUCOSE F - 18 (FDG) INJECTION
10.8000 | Freq: Once | INTRAVENOUS | Status: AC | PRN
  Administered 2024-02-09: 10.8 via INTRAVENOUS

## 2024-02-13 DIAGNOSIS — S83242D Other tear of medial meniscus, current injury, left knee, subsequent encounter: Secondary | ICD-10-CM | POA: Insufficient documentation

## 2024-02-16 ENCOUNTER — Encounter (HOSPITAL_COMMUNITY): Payer: Self-pay | Admitting: Oncology

## 2024-02-18 ENCOUNTER — Ambulatory Visit: Payer: Self-pay | Admitting: Oncology

## 2024-02-20 ENCOUNTER — Encounter (HOSPITAL_COMMUNITY): Payer: Self-pay

## 2024-02-21 ENCOUNTER — Telehealth: Payer: Self-pay | Admitting: Oncology

## 2024-02-21 NOTE — Telephone Encounter (Signed)
 Patient called to reschedule 8:30 am appointment on Friday to 900 due to having pre op appointment. Appointment rescheduled as requested

## 2024-02-23 ENCOUNTER — Other Ambulatory Visit: Payer: Self-pay

## 2024-02-23 ENCOUNTER — Ambulatory Visit: Admitting: Oncology

## 2024-02-23 ENCOUNTER — Inpatient Hospital Stay: Attending: Oncology | Admitting: Oncology

## 2024-02-23 ENCOUNTER — Encounter: Payer: Self-pay | Admitting: Oncology

## 2024-02-23 ENCOUNTER — Ambulatory Visit: Admitting: Student in an Organized Health Care Education/Training Program

## 2024-02-23 ENCOUNTER — Encounter: Payer: Self-pay | Admitting: Student in an Organized Health Care Education/Training Program

## 2024-02-23 ENCOUNTER — Telehealth: Payer: Self-pay | Admitting: *Deleted

## 2024-02-23 ENCOUNTER — Ambulatory Visit
Admission: RE | Admit: 2024-02-23 | Discharge: 2024-02-23 | Disposition: A | Discharge status: Home / Self Care | Source: Ambulatory Visit | Attending: Student in an Organized Health Care Education/Training Program | Admitting: Student in an Organized Health Care Education/Training Program

## 2024-02-23 ENCOUNTER — Telehealth: Payer: Self-pay

## 2024-02-23 VITALS — BP 108/60 | HR 94 | Temp 97.7°F | Ht 71.0 in | Wt 195.0 lb

## 2024-02-23 VITALS — BP 130/91 | HR 90 | Temp 96.2°F | Resp 18 | Ht 71.0 in | Wt 194.0 lb

## 2024-02-23 DIAGNOSIS — Z803 Family history of malignant neoplasm of breast: Secondary | ICD-10-CM | POA: Diagnosis not present

## 2024-02-23 DIAGNOSIS — D75839 Thrombocytosis, unspecified: Secondary | ICD-10-CM | POA: Diagnosis not present

## 2024-02-23 DIAGNOSIS — Z87891 Personal history of nicotine dependence: Secondary | ICD-10-CM | POA: Diagnosis not present

## 2024-02-23 DIAGNOSIS — F419 Anxiety disorder, unspecified: Secondary | ICD-10-CM | POA: Diagnosis not present

## 2024-02-23 DIAGNOSIS — R911 Solitary pulmonary nodule: Secondary | ICD-10-CM

## 2024-02-23 DIAGNOSIS — R918 Other nonspecific abnormal finding of lung field: Secondary | ICD-10-CM

## 2024-02-23 DIAGNOSIS — D472 Monoclonal gammopathy: Secondary | ICD-10-CM | POA: Diagnosis present

## 2024-02-23 MED ORDER — ALPRAZOLAM 0.5 MG PO TABS: 0.5000 mg | ORAL_TABLET | Freq: Three times a day (TID) | ORAL | 0 refills | Status: DC | PRN

## 2024-02-23 MED ORDER — ALPRAZOLAM 0.5 MG PO TABS: 0.5000 mg | ORAL_TABLET | Freq: Three times a day (TID) | ORAL | 0 refills | Status: AC | PRN

## 2024-02-23 NOTE — Telephone Encounter (Signed)
 Per Dr. Isadora:  Pt has been rescheduled for 02/27/24 at 12pm. Pt had came back to the clinic to request an earlier date and pushed back his upcoming knee surgery. Dr. Isadora approved of the moved appointment.

## 2024-02-23 NOTE — Telephone Encounter (Addendum)
 Bronchoscopy 03/12/2024  9:00am at [facility]  Dx: R91.1 CPT code: 68372 Jay Trujillo please see Bronch info.    Patient aware of time and date.

## 2024-02-23 NOTE — Telephone Encounter (Signed)
 Hematology clearance faxed for patient to have surgery to [phone].

## 2024-02-23 NOTE — Telephone Encounter (Signed)
 For the code 68372 Prior Auth Not Required Refer # [id number]

## 2024-02-23 NOTE — Progress Notes (Signed)
 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 Maree from neurology for evaluation of numbness in his extremities.  As a part of his workup patient underwent SPEP Which incidentally showed IgG kappa M protein of 0.9 g.  Patient has been referred for the same.  Patient admits to being under stress with his day-to-day life but is otherwise very active.  Occasional joint stiffness which is self-limited.  Denies any changes in his appetite or weight.   Results of myeloma workup from 01/16/2024 were as follows: CBC was normal with an H&H of 15.4/44.6. CMP was normal with a creatinine of 0.98. Calcium mildly low at 8.6 with a total protein of 7. Myeloma panel showed 0.8 g of IgG kappa monoclonal protein. Serum free kappa light chain elevated at 205 with a free light chain ratio of 26.6. Random urine protein electrophoresis did not show any evidence of M protein   Bone marrow biopsy on 02/06/2024 showed hypercellular bone marrow involved with kappa restricted plasma cell neoplasm at approximately 15% overall and mild megakaryocytic hyperplasia.  Thrombocytosis could be reactive.  [facility] FISH panel was negative for BCR-ABL on the bone marrow.  FISH for myeloma showed borderline positivity for translocation 11:14  PET CT scan on 02/09/2024 did not show any evidence of lytic lesions from myeloma.   Incidentally he was found to have a bilobed left upper lobe lung lesion measuring 2.1 x 1 cm with an SUV of 4.8.  Interval history-patient is anxious about PET scan findings and the next steps.  ECOG PS- 0 Pain scale- 0   Review of systems- Review of Systems  Constitutional:  Negative for chills, fever, malaise/fatigue and weight loss.  HENT:  Negative for congestion, ear discharge and nosebleeds.   Eyes:  Negative for blurred vision.  Respiratory:  Negative for cough, hemoptysis, sputum production, shortness of breath and wheezing.   Cardiovascular:  Negative for chest pain, palpitations, orthopnea and claudication.  Gastrointestinal:  Negative for abdominal pain, blood in stool, constipation, diarrhea, heartburn, melena, nausea and vomiting.  Genitourinary:  Negative for dysuria, flank pain, frequency, hematuria and urgency.  Musculoskeletal:  Negative for back pain, joint pain and myalgias.  Skin:  Negative for rash.  Neurological:  Negative for dizziness, tingling, focal weakness, seizures, weakness and headaches.  Endo/Heme/Allergies:  Does not bruise/bleed easily.  Psychiatric/Behavioral:  Negative for depression and suicidal ideas. The patient is nervous/anxious. The patient does not have insomnia.       No Known Allergies   Past Medical History:  Diagnosis Date   Achilles tendinitis of left lower extremity 08/23/2022   Allergic rhinitis 03/14/2011   Back pain 12/09/2011   Chest pain 07/25/2023   Chronic insomnia 07/25/2017   Sleep walking on ambien. Trazodone started 1/19     GERD (gastroesophageal reflux disease)    HTN (hypertension) 02/10/2011   Hypercholesteremia    Hyperlipidemia 02/28/2019   Hypertension    Lipoma of abdominal wall  multiple   Low testosterone in male    OSA (obstructive sleep apnea) 09/08/2017   PONV (postoperative nausea and vomiting)    Skin mass 01/20/2017   Sleep apnea    cannot tolerate cpap   Umbilical hernia      Past  Surgical History:  Procedure Laterality Date   HERNIA REPAIR Right 2023   IR BONE MARROW BIOPSY & ASPIRATION  02/06/2024   LIPOMA EXCISION     x2   LIPOMA EXCISION N/A 08/26/2022   Procedure: EXCISION LIPOMA;  Surgeon: Rodolph Romano, MD;  Location: [facility] ORS;  Service: General;  Laterality: N/A;    Social History   Socioeconomic History   Marital status: Married    Spouse name: Not on file   Number of children: Not on file   Years of education: Not on file   Highest education level: Not on file  Occupational History   Not on file  Tobacco Use   Smoking status: Former    Current packs/day: 0.00    Types: Cigarettes    Quit date: 2003    Years since quitting: 22.6    Passive exposure: Never   Smokeless tobacco: Never  Vaping Use   Vaping status: Never Used  Substance and Sexual Activity   Alcohol use: Yes    Comment: socially   Drug use: No   Sexual activity: Yes  Other Topics Concern   Not on file  Social History Narrative   Not on file   Social Drivers of Health   Financial Resource Strain: Low Risk  (06/13/2023)   Received from [facility]   Overall Financial Resource Strain (CARDIA)    Difficulty of Paying Living Expenses: Not hard at all  Food Insecurity: No Food Insecurity (01/16/2024)   Hunger Vital Sign    Worried About Running Out of Food in the Last Year: Never true    Ran Out of Food in the Last Year: Never true  Transportation Needs: No Transportation Needs (01/16/2024)   PRAPARE - Administrator, Civil Service (Medical): No    Lack of Transportation (Non-Medical): No  Physical Activity: Not on file  Stress: Not on file  Social Connections: Not on file  Intimate Partner Violence: Not At Risk (01/16/2024)   Humiliation, Afraid, Rape, and Kick questionnaire    Fear of Current or Ex-Partner: No    Emotionally Abused: No    Physically Abused: No    Sexually Abused: No    Family History  Problem Relation Age of Onset    Diabetes Mother    Heart Problems Father    Breast cancer Maternal Grandmother      Current Outpatient Medications:    amphetamine-dextroamphetamine (ADDERALL XR) 30 MG 24 hr capsule, Take 30 mg by mouth every morning., Disp: , Rfl:    clomiPHENE  (CLOMID ) 50 MG tablet, Take 0.5 tablets (25 mg total) by mouth daily. (Patient not taking: Reported on 08/22/2022), Disp: 30 tablet, Rfl: 6   clotrimazole-betamethasone (LOTRISONE) lotion, Apply topically 2 (two) times daily. (Patient not taking: No sig reported), Disp: , Rfl:    diazepam  (VALIUM ) 5 MG tablet, Take 0.5-1 tablet 30 to 60 minutes before PET scan, Disp: 2 tablet, Rfl: 0   escitalopram (LEXAPRO) 10 MG tablet, Take 20 mg by mouth daily., Disp: , Rfl:    gabapentin (NEURONTIN) 100 MG capsule, Take 100 mg by mouth at bedtime. (Patient not taking: No sig reported), Disp: , Rfl:    HYDROcodone -acetaminophen  (NORCO/VICODIN) 5-325  MG tablet, Take 2 tablets by mouth every 8 (eight) hours as needed. (Patient not taking: No sig reported), Disp: 14 tablet, Rfl: 0   lidocaine  (XYLOCAINE ) 2 % solution, Use as directed 15 mLs in the mouth or throat every 6 (six) hours as needed for mouth pain. (Patient not taking: No sig reported), Disp: 300 mL, Rfl: 0   lisinopril (ZESTRIL) 20 MG tablet, Take 1 tablet by mouth every morning., Disp: , Rfl:    omeprazole (PRILOSEC OTC) 20 MG tablet, Take 20 mg by mouth as needed., Disp: , Rfl:    ondansetron  (ZOFRAN -ODT) 4 MG disintegrating tablet, Take 1 tablet (4 mg total) by mouth every 6 (six) hours as needed for nausea or vomiting., Disp: 20 tablet, Rfl: 0   pantoprazole  (PROTONIX ) 40 MG tablet, Take 1 tablet (40 mg total) by mouth daily. (Patient not taking: Reported on 01/23/2024), Disp: 30 tablet, Rfl: 1   rosuvastatin (CRESTOR) 10 MG tablet, Take 1 tablet by mouth every morning., Disp: , Rfl:    sucralfate (CARAFATE) 1 g tablet, Take 1 g by mouth 4 (four) times daily -  with meals and at bedtime. (Patient not  taking: No sig reported), Disp: , Rfl:    traZODone (DESYREL) 50 MG tablet, Take 50 mg by mouth at bedtime. (Patient not taking: No sig reported), Disp: , Rfl:   Physical exam:  Vitals:   02/23/24 0910  BP: (!) 130/91  Pulse: 90  Resp: 18  Temp: (!) 96.2 F (35.7 C)  TempSrc: Tympanic  SpO2: 99%  Weight: 194 lb (88 kg)  Height: 5' 11 (1.803 m)   Physical Exam Cardiovascular:     Rate and Rhythm: Normal rate and regular rhythm.     Heart sounds: Normal heart sounds.  Pulmonary:     Effort: Pulmonary effort is normal.     Breath sounds: Normal breath sounds.  Skin:    General: Skin is warm and dry.  Neurological:     Mental Status: He is alert and oriented to person, place, and time.      I have personally reviewed labs listed below:    Latest Ref Rng & Units 01/16/2024    2:46 PM  CMP  Glucose 70 - 99 mg/dL 95   BUN 6 - 20 mg/dL 16   Creatinine 9.38 - 1.24 mg/dL 9.01   Sodium 864 - 854 mmol/L 132   Potassium 3.5 - 5.1 mmol/L 4.3   Chloride 98 - 111 mmol/L 101   CO2 22 - 32 mmol/L 26   Calcium 8.9 - 10.3 mg/dL 8.6   Total Protein 6.5 - 8.1 g/dL 7.0   Total Bilirubin 0.0 - 1.2 mg/dL 0.7   Alkaline Phos 38 - 126 U/L 70   AST 15 - 41 U/L 23   ALT 0 - 44 U/L 26       Latest Ref Rng & Units 02/06/2024    9:21 AM  CBC  WBC 4.0 - 10.5 K/uL 4.0   Hemoglobin 13.0 - 17.0 g/dL 84.1   Hematocrit 60.9 - 52.0 % 48.3   Platelets 150 - 400 K/uL 536    I have personally reviewed Radiology images listed below: No images are attached to the encounter.  NM PET Image Initial (PI) Whole Body Result Date: 02/12/2024 CLINICAL DATA:  Possible IgG kappa monoclonal gammopathy EXAM: NUCLEAR MEDICINE PET WHOLE BODY TECHNIQUE: 10.8 mCi F-18 FDG was injected intravenously. Full-ring PET imaging was performed from the head to foot after the radiotracer. CT  data was obtained and used for attenuation correction and anatomic localization. Fasting blood glucose: 118 mg/dl COMPARISON:  None  Available. FINDINGS: Mediastinal blood pool activity: SUV max 2.6 HEAD/NECK: No hypermetabolic activity in the scalp. No hypermetabolic cervical lymph nodes. Incidental CT findings: Paranasal sinuses and mastoid air cells are clear. The parotid glands, submandibular glands and thyroid glands unremarkable. CHEST: No specific abnormal uptake above blood pool in the axillary region, hilum or mediastinum. Bilobed left upper lobe nodule measuring 2.1 x 1.0 cm on series 6, image 72 has abnormal uptake with maximum SUV 4.8. There are some tiny nodules elsewhere in addition which are not with abnormal uptake but are quite small. Example 4 mm series 6, image 68 left upper lobe, left lower lobe image 79. Right lower lobe image 77. Incidental CT findings: Linear opacity at the bases likely scar or atelectasis. Breathing motion. Coronary artery calcifications are seen. Please correlate for other coronary risk factors. Heart is nonenlarged. Patulous esophagus. ABDOMEN/PELVIS: No abnormal hypermetabolic activity within the liver, pancreas, adrenal glands, or spleen. No hypermetabolic lymph nodes in the abdomen or pelvis. Incidental CT findings: Diffuse colonic stool. No bowel obstruction. No renal or ureteral stones. Normal appendix. Small fat containing right inguinal hernia. Grossly the liver, spleen, adrenal glands and pancreas are preserved. Skeleton/extremities: No focal hypermetabolic activity to suggest skeletal metastasis. No abnormal soft tissue uptake along the extremities. Incidental CT findings: Mild degenerative changes. IMPRESSION: No specific abnormal uptake along parenchymal organs, lymph node chains or osseous structures. However there are some scattered lung nodules many which are small. However larger bilobed lesion in the left upper lobe measuring 2.1 x 1.0 cm has maximum SUV of 4.8. A malignant lesion is in the differential. Recommend further evaluation and correlate to any prior Electronically Signed   By:  Ranell Bring M.D.   On: 02/12/2024 14:42   IR BONE MARROW BIOPSY & ASPIRATION Result Date: 02/06/2024 CLINICAL DATA:  MGUS. EXAM: Fluoroscopy bone marrow biopsy TECHNIQUE: Fluoroscopy CONTRAST:  None ANESTHESIA/SEDATION: Moderate (conscious) sedation was employed during this procedure. A total of Versed  2 mg and Fentanyl  100 mcg was administered intravenously by the radiology nurse. Total intra-service moderate Sedation Time: 10 minutes. The patient's level of consciousness and vital signs were monitored continuously by radiology nursing throughout the procedure under my direct supervision. FLUOROSCOPY: 8.7 mGy COMPARISON:  None FINDINGS: The patient was placed in prone position on the fluoroscopy table. Radiopaque markers were placed on the patient's skin and initial imaging of the pelvis was performed. The patient's skin was then prepped and draped in the usual sterile fashion. Moderate sedation was provided for by the nursing staff under my supervision utilizing intravenous Versed  and fentanyl . The nurse had no other duties other than monitoring the patient and providing sedation during the procedure. I was present for the entire procedure. 1% lidocaine  was used to infiltrate the skin at the access site prior to a stab incision. Local anesthesia was then used to infiltrate the region of soft tissue from the skin to the left iliac bone. The bone marrow needle was then advanced and imaging demonstrated the needle tip to be in the cortex of the left iliac bone under fluoroscopic guidance. The bone was then penetrated and a sample of the bone marrow was obtained. After the sample was evaluated, approximately 5 mL of heparinized bone marrow sample was obtained by aspiration. A core sample was then obtained by drilling of the bone needle approximately 2 cm deeper into the left iliac bone.  The needle and sample were retrieved. All needles were then removed from the patient. Sterile dressing was applied. IMPRESSION:  Satisfactory core needle biopsy and aspiration of the left iliac bone marrow under fluoro guidance. Electronically Signed   By: Cordella Banner   On: 02/06/2024 11:11     Assessment and plan- Patient is a 53 y.o. 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  No evidence of crab criteria.  However given that his free light chain ratio was elevated at 26 bone marrow biopsy was performed in July 2025 which shows 15% clonal kappa restricted plasma cells.  There was a relative thrombocytosis which could be reactive and BCR-ABL testing on the bone marrow was negative.  Given that patient has more than 10% clonal plasma cells in the bone marrow he will fall under the category of smoldering multiple myeloma.  He has 1 out of the 3 risk factors since his light chain ratio is greater than 20 but M protein is less than 2 g and clonal protein in the bone marrow is less than 20%.  He therefore falls in the intermediate risk group.    Intermediate risk (one factor present) - Estimated median TTP 68 months; of the initial cohort, 15 percent progressed per year during the first two years, 7 percent per year during the next three years, and 4 percent per year during the next five years. Risk of progression was 26 percent at 2 years, 47 percent at 5 years, and 65 percent at 10 years.   From smoldering multiple myeloma perspective I will continue to monitor CBC with differential CMP myeloma panel and serum free light chains in 6 months and see him thereafter.  I have also reviewed PET CT scan Images independently and discussed findings with the patient which was mainly done to rule out any lytic lesions from multiple myeloma and he does not have any of those.  However he was incidentally found to have a left upper lobe lung mass  measuring 2.1 by 1 cm with an SUV of 4.8.  I have discussed this case with Dr. Isadora  from pulmonary who has agreed to see him and consider bronchoscopy.  I will tentatively see him back in 3 weeks time to discuss bronchoscopy results and further management   Visit Diagnosis 1. Smoldering multiple myeloma   2. Mass of upper lobe of left lung   3. Anxiety      Dr. Annah Skene, MD, MPH [facility] [phone] 02/23/2024 9:09 AM

## 2024-02-23 NOTE — Progress Notes (Signed)
 Assessment & Plan:   #Lung nodule (Primary)  Nodule Location: left upper lobe Nodule Size: 2.1 cm Nodule Spiculation: No Associated Lymphadenopathy: No Smoking Status (former) and pack years 10 Extrathoracic cancer > 5 years prior (no); smoldering myeloma noted SPN malignancy risk score [facility]): 35 % risk of malignancy ECOG: 0  The patient is here to discuss their imaging abnormalities which include a bilobed left upper lobe pulmonary nodule that was FDG avid on his most recent PET/CT.  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  Discussed the differential diagnosis for this nodule which includes both benign and malignant etiologies. Given finding of FDG avidity and nodule size, we need to rule out lung cancer.  Plasmacytoma is also on the differential as are benign etiologies such as infections.  Given risk of malignancy, I offered [name] expedited robotic assisted navigational bronchoscopy for tissue acquisition.  He has an upcoming orthopedic surgery for repair of left knee meniscus on Monday and would not be able to have his bronchoscopy next week on 8/12. I offered the next available date on 8/26 and he agrees to schedule then.  We discussed the importance of diagnosis and staging in lung malignancies, and the approach to obtaining a tissue diagnosis which would include robotic assisted navigational bronchoscopy with endobronchial ultrasound guided sampling.  We also discussed the risks associated with the procedure which include a 2% risk of pneumothorax, infection, bleeding, and nondiagnostic procedure in detail.  I explained that patients typically are able to return home the same day of the procedure, but in rare cases admission to the hospital for observation and treatment is required.  After our discussion, the patient elected to proceed with the procedure  Recommendations:  - CT SUPER D CHEST WO CONTRAST; Future -  Procedural/ Surgical Case Request: VIDEO BRONCHOSCOPY WITH ENDOBRONCHIAL NAVIGATION; Future  # Preoperative risk assessment  Patient is overall healthy and at his baseline. He has an upcoming orthopedic surgery for repair of left knee meniscus.  He scores 0.1% risk of postoperative pulm complication on Gupta risk score, he also scores 3 points (low risk) on ARISCAT risk score.  Overall he is optimized and okay to proceed with surgical intervention from a pulmonary perspective.    We did discuss his anticoagulation post orthopedic surgery in relation to his upcoming bronchoscopy.  While I am okay with 81 mg of aspirin, I would be hesitant to proceed should he be on 325 mg of aspirin daily and we will want him to be off of it for a week prior to biopsy. If he is offered Lovenox, we could hold that for 24 hours before bronchoscopy.  I spent 60 minutes caring for this patient today, including preparing to see the patient, obtaining a medical history , reviewing a separately obtained history, performing a medically appropriate examination and/or evaluation, counseling and educating the patient/family/caregiver, ordering medications, tests, or procedures, documenting clinical information in the electronic health record, and independently interpreting results (not separately reported/billed) and communicating results to the patient/family/caregiver  Belva November, MD [facility]  End of visit medications:  No orders of the defined types were placed in this encounter.    Current Outpatient Medications:    ALPRAZolam  (XANAX ) 0.5 MG tablet, Take 1 tablet (0.5 mg total) by mouth every 8 (eight) hours as needed for anxiety., Disp: 10 tablet, Rfl: 0   amphetamine-dextroamphetamine (ADDERALL XR) 30 MG 24 hr capsule, Take 30 mg by mouth  every morning., Disp: , Rfl:    escitalopram (LEXAPRO) 10 MG tablet, Take 20 mg by mouth daily., Disp: , Rfl:    HYDROcodone -acetaminophen  (NORCO/VICODIN)  5-325 MG tablet, Take 2 tablets by mouth every 8 (eight) hours as needed., Disp: 14 tablet, Rfl: 0   lisinopril (ZESTRIL) 20 MG tablet, Take 1 tablet by mouth every morning., Disp: , Rfl:    omeprazole (PRILOSEC OTC) 20 MG tablet, Take 20 mg by mouth as needed., Disp: , Rfl:    ondansetron  (ZOFRAN -ODT) 4 MG disintegrating tablet, Take 1 tablet (4 mg total) by mouth every 6 (six) hours as needed for nausea or vomiting., Disp: 20 tablet, Rfl: 0   rosuvastatin (CRESTOR) 10 MG tablet, Take 1 tablet by mouth every morning., Disp: , Rfl:    Subjective:   PATIENT ID: Jay Trujillo GENDER: male DOB: Oct 23, 1970, MRN: [id number]  Chief Complaint  Patient presents with   Results    Discuss PET scan - hematologist     HPI  Patient is a pleasant 53 year old male presenting to clinic for the evaluation of the left upper lobe pulmonary nodule.  Patient was being seen by his neurologist for the evaluation of paresthesia when blood work incidentally showed protein spike prompting evaluation for multiple myeloma.  He has been seen by Dr. Melanee from Hem/Onc where investigation included bone marrow biopsy as well as a PET/CT.  Oncological workup yielded a diagnosis of smoldering myeloma for which the patient will be monitored closely with repeat blood work.  He is referred to pulmonary for the evaluation of the left upper lobe pulmonary nodule.  He has no respiratory symptoms and is at his baseline health.  He has no shortness of breath, chest pain, cough, sputum production, fevers, chills, or weight changes.  He has not had any pulm nodules in the past.  Reports 10-pack-year smoking history, quit 19 years ago after the birth of his son.  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  Ancillary information including prior medications, full  medical/surgical/family/social histories, and PFTs (when available) are listed below and have been reviewed.   Review of Systems  Constitutional:  Negative for chills and fever.  Respiratory:  Negative for cough, hemoptysis, sputum production, shortness of breath and wheezing.   Cardiovascular:  Negative for chest pain.     Objective:   Vitals:   02/23/24 1153  BP: 108/60  Pulse: 94  Temp: 97.7 F (36.5 C)  TempSrc: Oral  SpO2: 98%  Weight: 195 lb (88.5 kg)  Height: 5' 11 (1.803 m)   98% on RA BMI Readings from Last 3 Encounters:  02/23/24 27.20 kg/m  02/23/24 27.06 kg/m  02/06/24 26.68 kg/m   Wt Readings from Last 3 Encounters:  02/23/24 195 lb (88.5 kg)  02/23/24 194 lb (88 kg)  02/06/24 191 lb 4.8 oz (86.8 kg)    Physical Exam Constitutional:      Appearance: Normal appearance.  Cardiovascular:     Rate and Rhythm: Normal rate and regular rhythm.     Pulses: Normal pulses.     Heart sounds: Normal heart sounds.  Pulmonary:     Effort: Pulmonary effort is normal.     Breath sounds: Normal breath sounds. No wheezing, rhonchi or rales.  Neurological:     General: No focal deficit present.     Mental Status: He is alert  and oriented to person, place, and time. Mental status is at baseline.       Ancillary Information    Past Medical History:  Diagnosis Date   Achilles tendinitis of left lower extremity 08/23/2022   Allergic rhinitis 03/14/2011   Back pain 12/09/2011   Chest pain 07/25/2023   Chronic insomnia 07/25/2017   Sleep walking on ambien. Trazodone started 1/19     GERD (gastroesophageal reflux disease)    HTN (hypertension) 02/10/2011   Hypercholesteremia    Hyperlipidemia 02/28/2019   Hypertension    Lipoma of abdominal wall    multiple   Low testosterone in male    OSA (obstructive sleep apnea) 09/08/2017   PONV (postoperative nausea and vomiting)    Skin mass 01/20/2017   Sleep apnea    cannot tolerate cpap   Umbilical hernia       Family History  Problem Relation Age of Onset   Diabetes Mother    Heart Problems Father    Breast cancer Maternal Grandmother      Past Surgical History:  Procedure Laterality Date   HERNIA REPAIR Right 2023   IR BONE MARROW BIOPSY & ASPIRATION  02/06/2024   LIPOMA EXCISION     x2   LIPOMA EXCISION N/A 08/26/2022   Procedure: EXCISION LIPOMA;  Surgeon: Rodolph Romano, MD;  Location: [facility] ORS;  Service: General;  Laterality: N/A;    Social History   Socioeconomic History   Marital status: Married    Spouse name: Not on file   Number of children: Not on file   Years of education: Not on file   Highest education level: Not on file  Occupational History   Not on file  Tobacco Use   Smoking status: Former    Current packs/day: 0.00    Types: Cigarettes    Quit date: 2003    Years since quitting: 22.6    Passive exposure: Never   Smokeless tobacco: Never  Vaping Use   Vaping status: Never Used  Substance and Sexual Activity   Alcohol use: Yes    Comment: socially   Drug use: No   Sexual activity: Yes  Other Topics Concern   Not on file  Social History Narrative   Not on file   Social Drivers of Health   Financial Resource Strain: Low Risk  (06/13/2023)   Received from [facility]   Overall Financial Resource Strain (CARDIA)    Difficulty of Paying Living Expenses: Not hard at all  Food Insecurity: No Food Insecurity (01/16/2024)   Hunger Vital Sign    Worried About Running Out of Food in the Last Year: Never true    Ran Out of Food in the Last Year: Never true  Transportation Needs: No Transportation Needs (01/16/2024)   PRAPARE - Administrator, Civil Service (Medical): No    Lack of Transportation (Non-Medical): No  Physical Activity: Not on file  Stress: Not on file  Social Connections: Not on file  Intimate Partner Violence: Not At Risk (01/16/2024)   Humiliation, Afraid, Rape, and Kick questionnaire    Fear of  Current or Ex-Partner: No    Emotionally Abused: No    Physically Abused: No    Sexually Abused: No     No Known Allergies   CBC    Component Value Date/Time   WBC 4.0 02/06/2024 0921   RBC 5.21 02/06/2024 0921   HGB 15.8 02/06/2024 0921   HCT 48.3 02/06/2024 0921  PLT 536 (H) 02/06/2024 0921   MCV 92.7 02/06/2024 0921   MCH 30.3 02/06/2024 0921   MCHC 32.7 02/06/2024 0921   RDW 11.7 02/06/2024 0921   LYMPHSABS 1.4 02/06/2024 0921   MONOABS 0.5 02/06/2024 0921   EOSABS 0.1 02/06/2024 0921   BASOSABS 0.1 02/06/2024 9078    Pulmonary Functions Testing Results:     No data to display          Outpatient Medications Prior to Visit  Medication Sig Dispense Refill   ALPRAZolam  (XANAX ) 0.5 MG tablet Take 1 tablet (0.5 mg total) by mouth every 8 (eight) hours as needed for anxiety. 10 tablet 0   amphetamine-dextroamphetamine (ADDERALL XR) 30 MG 24 hr capsule Take 30 mg by mouth every morning.     escitalopram (LEXAPRO) 10 MG tablet Take 20 mg by mouth daily.     HYDROcodone -acetaminophen  (NORCO/VICODIN) 5-325 MG tablet Take 2 tablets by mouth every 8 (eight) hours as needed. 14 tablet 0   lisinopril (ZESTRIL) 20 MG tablet Take 1 tablet by mouth every morning.     omeprazole (PRILOSEC OTC) 20 MG tablet Take 20 mg by mouth as needed.     ondansetron  (ZOFRAN -ODT) 4 MG disintegrating tablet Take 1 tablet (4 mg total) by mouth every 6 (six) hours as needed for nausea or vomiting. 20 tablet 0   rosuvastatin (CRESTOR) 10 MG tablet Take 1 tablet by mouth every morning.     lidocaine  (XYLOCAINE ) 2 % solution Use as directed 15 mLs in the mouth or throat every 6 (six) hours as needed for mouth pain. (Patient not taking: Reported on 02/23/2024) 300 mL 0   No facility-administered medications prior to visit.

## 2024-02-23 NOTE — Progress Notes (Signed)
 Having knee surgery Monday, Dr. Job.  PET 02/09/24, Bone marrow bx 02/06/24. MRI done thru [facility].  Pt has been stressed, got some results before seeing doctor. Mind will not shut off. He states he called the office while you were out asking for xanax  but I don't see this message.

## 2024-02-23 NOTE — H&P (View-Only) (Signed)
 Assessment & Plan:   #Lung nodule (Primary)  Nodule Location: left upper lobe Nodule Size: 2.1 cm Nodule Spiculation: No Associated Lymphadenopathy: No Smoking Status (former) and pack years 10 Extrathoracic cancer > 5 years prior (no); smoldering myeloma noted SPN malignancy risk score [facility]): 35 % risk of malignancy ECOG: 0  The patient is here to discuss their imaging abnormalities which include a bilobed left upper lobe pulmonary nodule that was FDG avid on his most recent PET/CT.  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  Discussed the differential diagnosis for this nodule which includes both benign and malignant etiologies. Given finding of FDG avidity and nodule size, we need to rule out lung cancer.  Plasmacytoma is also on the differential as are benign etiologies such as infections.  Given risk of malignancy, I offered Jay Trujillo expedited robotic assisted navigational bronchoscopy for tissue acquisition.  He has an upcoming orthopedic surgery for repair of left knee meniscus on Monday and would not be able to have his bronchoscopy next week on 8/12. I offered the next available date on 8/26 and he agrees to schedule then.  We discussed the importance of diagnosis and staging in lung malignancies, and the approach to obtaining a tissue diagnosis which would include robotic assisted navigational bronchoscopy with endobronchial ultrasound guided sampling.  We also discussed the risks associated with the procedure which include a 2% risk of pneumothorax, infection, bleeding, and nondiagnostic procedure in detail.  I explained that patients typically are able to return home the same day of the procedure, but in rare cases admission to the hospital for observation and treatment is required.  After our discussion, the patient elected to proceed with the procedure  Recommendations:  - CT SUPER D CHEST WO CONTRAST; Future -  Procedural/ Surgical Case Request: VIDEO BRONCHOSCOPY WITH ENDOBRONCHIAL NAVIGATION; Future  # Preoperative risk assessment  Patient is overall healthy and at his baseline. He has an upcoming orthopedic surgery for repair of left knee meniscus.  He scores 0.1% risk of postoperative pulm complication on Gupta risk score, he also scores 3 points (low risk) on ARISCAT risk score.  Overall he is optimized and okay to proceed with surgical intervention from a pulmonary perspective.    We did discuss his anticoagulation post orthopedic surgery in relation to his upcoming bronchoscopy.  While I am okay with 81 mg of aspirin, I would be hesitant to proceed should he be on 325 mg of aspirin daily and we will want him to be off of it for a week prior to biopsy. If he is offered Lovenox, we could hold that for 24 hours before bronchoscopy.  I spent 60 minutes caring for this patient today, including preparing to see the patient, obtaining a medical history , reviewing a separately obtained history, performing a medically appropriate examination and/or evaluation, counseling and educating the patient/family/caregiver, ordering medications, tests, or procedures, documenting clinical information in the electronic health record, and independently interpreting results (not separately reported/billed) and communicating results to the patient/family/caregiver  Belva November, MD [facility]  End of visit medications:  No orders of the defined types were placed in this encounter.    Current Outpatient Medications:    ALPRAZolam  (XANAX ) 0.5 MG tablet, Take 1 tablet (0.5 mg total) by mouth every 8 (eight) hours as needed for anxiety., Disp: 10 tablet, Rfl: 0   amphetamine-dextroamphetamine (ADDERALL XR) 30 MG 24 hr capsule, Take 30 mg by mouth  every morning., Disp: , Rfl:    escitalopram (LEXAPRO) 10 MG tablet, Take 20 mg by mouth daily., Disp: , Rfl:    HYDROcodone -acetaminophen  (NORCO/VICODIN)  5-325 MG tablet, Take 2 tablets by mouth every 8 (eight) hours as needed., Disp: 14 tablet, Rfl: 0   lisinopril (ZESTRIL) 20 MG tablet, Take 1 tablet by mouth every morning., Disp: , Rfl:    omeprazole (PRILOSEC OTC) 20 MG tablet, Take 20 mg by mouth as needed., Disp: , Rfl:    ondansetron  (ZOFRAN -ODT) 4 MG disintegrating tablet, Take 1 tablet (4 mg total) by mouth every 6 (six) hours as needed for nausea or vomiting., Disp: 20 tablet, Rfl: 0   rosuvastatin (CRESTOR) 10 MG tablet, Take 1 tablet by mouth every morning., Disp: , Rfl:    Subjective:   PATIENT ID: Jay Trujillo GENDER: male DOB: Oct 23, 1970, MRN: [id number]  Chief Complaint  Patient presents with   Results    Discuss PET scan - hematologist     HPI  Patient is a pleasant 53 year old male presenting to clinic for the evaluation of the left upper lobe pulmonary nodule.  Patient was being seen by his neurologist for the evaluation of paresthesia when blood work incidentally showed protein spike prompting evaluation for multiple myeloma.  He has been seen by Dr. Melanee from Hem/Onc where investigation included bone marrow biopsy as well as a PET/CT.  Oncological workup yielded a diagnosis of smoldering myeloma for which the patient will be monitored closely with repeat blood work.  He is referred to pulmonary for the evaluation of the left upper lobe pulmonary nodule.  He has no respiratory symptoms and is at his baseline health.  He has no shortness of breath, chest pain, cough, sputum production, fevers, chills, or weight changes.  He has not had any pulm nodules in the past.  Reports 10-pack-year smoking history, quit 19 years ago after the birth of his son.  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  Ancillary information including prior medications, full  medical/surgical/family/social histories, and PFTs (when available) are listed below and have been reviewed.   Review of Systems  Constitutional:  Negative for chills and fever.  Respiratory:  Negative for cough, hemoptysis, sputum production, shortness of breath and wheezing.   Cardiovascular:  Negative for chest pain.     Objective:   Vitals:   02/23/24 1153  BP: 108/60  Pulse: 94  Temp: 97.7 F (36.5 C)  TempSrc: Oral  SpO2: 98%  Weight: 195 lb (88.5 kg)  Height: 5' 11 (1.803 m)   98% on RA BMI Readings from Last 3 Encounters:  02/23/24 27.20 kg/m  02/23/24 27.06 kg/m  02/06/24 26.68 kg/m   Wt Readings from Last 3 Encounters:  02/23/24 195 lb (88.5 kg)  02/23/24 194 lb (88 kg)  02/06/24 191 lb 4.8 oz (86.8 kg)    Physical Exam Constitutional:      Appearance: Normal appearance.  Cardiovascular:     Rate and Rhythm: Normal rate and regular rhythm.     Pulses: Normal pulses.     Heart sounds: Normal heart sounds.  Pulmonary:     Effort: Pulmonary effort is normal.     Breath sounds: Normal breath sounds. No wheezing, rhonchi or rales.  Neurological:     General: No focal deficit present.     Mental Status: He is alert  and oriented to person, place, and time. Mental status is at baseline.       Ancillary Information    Past Medical History:  Diagnosis Date   Achilles tendinitis of left lower extremity 08/23/2022   Allergic rhinitis 03/14/2011   Back pain 12/09/2011   Chest pain 07/25/2023   Chronic insomnia 07/25/2017   Sleep walking on ambien. Trazodone started 1/19     GERD (gastroesophageal reflux disease)    HTN (hypertension) 02/10/2011   Hypercholesteremia    Hyperlipidemia 02/28/2019   Hypertension    Lipoma of abdominal wall    multiple   Low testosterone in male    OSA (obstructive sleep apnea) 09/08/2017   PONV (postoperative nausea and vomiting)    Skin mass 01/20/2017   Sleep apnea    cannot tolerate cpap   Umbilical hernia       Family History  Problem Relation Age of Onset   Diabetes Mother    Heart Problems Father    Breast cancer Maternal Grandmother      Past Surgical History:  Procedure Laterality Date   HERNIA REPAIR Right 2023   IR BONE MARROW BIOPSY & ASPIRATION  02/06/2024   LIPOMA EXCISION     x2   LIPOMA EXCISION N/A 08/26/2022   Procedure: EXCISION LIPOMA;  Surgeon: Rodolph Romano, MD;  Location: [facility] ORS;  Service: General;  Laterality: N/A;    Social History   Socioeconomic History   Marital status: Married    Spouse name: Not on file   Number of children: Not on file   Years of education: Not on file   Highest education level: Not on file  Occupational History   Not on file  Tobacco Use   Smoking status: Former    Current packs/day: 0.00    Types: Cigarettes    Quit date: 2003    Years since quitting: 22.6    Passive exposure: Never   Smokeless tobacco: Never  Vaping Use   Vaping status: Never Used  Substance and Sexual Activity   Alcohol use: Yes    Comment: socially   Drug use: No   Sexual activity: Yes  Other Topics Concern   Not on file  Social History Narrative   Not on file   Social Drivers of Health   Financial Resource Strain: Low Risk  (06/13/2023)   Received from [facility]   Overall Financial Resource Strain (CARDIA)    Difficulty of Paying Living Expenses: Not hard at all  Food Insecurity: No Food Insecurity (01/16/2024)   Hunger Vital Sign    Worried About Running Out of Food in the Last Year: Never true    Ran Out of Food in the Last Year: Never true  Transportation Needs: No Transportation Needs (01/16/2024)   PRAPARE - Administrator, Civil Service (Medical): No    Lack of Transportation (Non-Medical): No  Physical Activity: Not on file  Stress: Not on file  Social Connections: Not on file  Intimate Partner Violence: Not At Risk (01/16/2024)   Humiliation, Afraid, Rape, and Kick questionnaire    Fear of  Current or Ex-Partner: No    Emotionally Abused: No    Physically Abused: No    Sexually Abused: No     No Known Allergies   CBC    Component Value Date/Time   WBC 4.0 02/06/2024 0921   RBC 5.21 02/06/2024 0921   HGB 15.8 02/06/2024 0921   HCT 48.3 02/06/2024 0921  PLT 536 (H) 02/06/2024 0921   MCV 92.7 02/06/2024 0921   MCH 30.3 02/06/2024 0921   MCHC 32.7 02/06/2024 0921   RDW 11.7 02/06/2024 0921   LYMPHSABS 1.4 02/06/2024 0921   MONOABS 0.5 02/06/2024 0921   EOSABS 0.1 02/06/2024 0921   BASOSABS 0.1 02/06/2024 9078    Pulmonary Functions Testing Results:     No data to display          Outpatient Medications Prior to Visit  Medication Sig Dispense Refill   ALPRAZolam  (XANAX ) 0.5 MG tablet Take 1 tablet (0.5 mg total) by mouth every 8 (eight) hours as needed for anxiety. 10 tablet 0   amphetamine-dextroamphetamine (ADDERALL XR) 30 MG 24 hr capsule Take 30 mg by mouth every morning.     escitalopram (LEXAPRO) 10 MG tablet Take 20 mg by mouth daily.     HYDROcodone -acetaminophen  (NORCO/VICODIN) 5-325 MG tablet Take 2 tablets by mouth every 8 (eight) hours as needed. 14 tablet 0   lisinopril (ZESTRIL) 20 MG tablet Take 1 tablet by mouth every morning.     omeprazole (PRILOSEC OTC) 20 MG tablet Take 20 mg by mouth as needed.     ondansetron  (ZOFRAN -ODT) 4 MG disintegrating tablet Take 1 tablet (4 mg total) by mouth every 6 (six) hours as needed for nausea or vomiting. 20 tablet 0   rosuvastatin (CRESTOR) 10 MG tablet Take 1 tablet by mouth every morning.     lidocaine  (XYLOCAINE ) 2 % solution Use as directed 15 mLs in the mouth or throat every 6 (six) hours as needed for mouth pain. (Patient not taking: Reported on 02/23/2024) 300 mL 0   No facility-administered medications prior to visit.

## 2024-02-23 NOTE — Progress Notes (Signed)
 History of Present Illness:   Jay Trujillo is a 53 y.o. male here for   Verbally consented to the use of AI for note-taking.   Chief Complaint  Patient presents with  . Pre-op Exam    History of Present Illness Jay Trujillo is a 53 year old male who presents for preoperative evaluation for knee surgery.  He is scheduled for knee surgery due to a torn meniscus on Monday 02/26/24. The knee pain is not severe until the meniscus 'pops' and gets caught in the joint, causing significant discomfort. The injury occurred when he kneeled down and then stood back up, resulting in a tear confirmed by MRI. He has not taken any pain medication today but has a prescription for Oxycodone , of which he has taken two doses previously. No shortness of breath, chest pain, or heart palpitations. He is able to climb stairs and walk several blocks without experiencing these symptoms.  He has been experiencing significant anxiety and panic attacks, which have affected his sleep. He is currently taking Lexapro for anxiety and has been prescribed a few Xanax  tablets for acute episodes.   He is also dealing with recent diagnosis of smoldering multiple myeloma and a new lung nodule found on PET scan.  He has a bronchoscopy, which has been scheduled for the 26th of this month. He has not taken aspirin yet and is considering the timing of the bronchoscopy in relation to his knee surgery.  He is an avid [employer]. 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 However, due to recent health concerns, he has not engaged in this activity as much as he would like. He plans to participate in paragliding activities over the weekend with friends and students, which he hopes will provide a mental reset.     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 Full-ring PET imaging was performed from the head to foot after the radiotracer. CT data was obtained and used for attenuation correction and anatomic localization. Fasting blood glucose: 118 mg/dl COMPARISON: None Available. FINDINGS: Mediastinal blood pool activity: SUV max 2.6 HEAD/NECK: No hypermetabolic activity in the scalp. No hypermetabolic cervical lymph nodes. Incidental CT findings: Paranasal sinuses and mastoid air cells are clear. The parotid glands, submandibular glands and thyroid glands unremarkable. CHEST: No specific abnormal uptake above blood pool in the axillary region, hilum or mediastinum. Bilobed left upper lobe nodule measuring 2.1 x 1.0 cm on series 6, image 72 has abnormal uptake with maximum SUV 4.8. There are some tiny nodules elsewhere in addition which are not with abnormal uptake but are quite small. Example 4 mm series 6, image 68 left upper lobe, left lower lobe image 79. Right lower lobe image 77. Incidental CT findings: Linear opacity at the bases likely scar or atelectasis. Breathing motion. Coronary artery calcifications are seen. Please correlate for other coronary risk factors. Heart is nonenlarged. Patulous esophagus. ABDOMEN/PELVIS: No abnormal hypermetabolic activity within the liver, pancreas, adrenal glands, or spleen. No hypermetabolic lymph nodes in the abdomen or pelvis. Incidental CT findings: Diffuse colonic stool. No bowel obstruction. No renal or ureteral stones. Normal appendix. Small fat containing right inguinal hernia. Grossly the liver, spleen, adrenal glands and pancreas are preserved. Skeleton/extremities: No focal hypermetabolic activity to suggest skeletal metastasis. No abnormal soft tissue uptake along the extremities. Incidental  CT findings: Mild degenerative changes. IMPRESSION: No specific abnormal uptake along parenchymal organs, lymph node chains or osseous structures. However there are some scattered lung nodules  many which are small. However larger bilobed lesion in the left upper lobe measuring 2.1 x 1.0 cm has maximum SUV of 4.8. A malignant lesion is in the differential. Recommend further evaluation and correlate to any prior Electronically Signed By: Ranell Bring M.D. On: 02/12/2024 14:42   IR BONE MARROW BIOPSY & ASPIRATION Result Date: 02/06/2024 CLINICAL DATA: MGUS. EXAM: Fluoroscopy bone marrow biopsy TECHNIQUE: Fluoroscopy CONTRAST: None ANESTHESIA/SEDATION: Moderate (conscious) sedation was employed during this procedure. A total of Versed  2 mg and Fentanyl  100 mcg was administered intravenously by the radiology nurse. Total intra-service moderate Sedation Time: 10 minutes. The patient's level of consciousness and vital signs were monitored continuously by radiology nursing throughout the procedure under my direct supervision. FLUOROSCOPY: 8.7 mGy COMPARISON: None FINDINGS: The patient was placed in prone position on the fluoroscopy table. Radiopaque markers were placed on the patient's skin and initial imaging of the pelvis was performed. The patient's skin was then prepped and draped in the usual sterile fashion. Moderate sedation was provided for by the nursing staff under my supervision utilizing intravenous Versed  and fentanyl . The nurse had no other duties other than monitoring the patient and providing sedation during the procedure. I was present for the entire procedure. 1% lidocaine  was used to infiltrate the skin at the access site prior to a stab incision. Local anesthesia was then used to infiltrate the region of soft tissue from the skin to the left iliac bone. The bone marrow needle was then advanced and imaging demonstrated the needle tip to be in the cortex of the left iliac bone under fluoroscopic guidance. The bone was then penetrated and a sample of the bone marrow was obtained. After the sample was evaluated, approximately 5 mL of heparinized bone marrow sample was obtained by aspiration. A  core sample was then obtained by drilling of the bone needle approximately 2 cm deeper into the left iliac bone. The needle and sample were retrieved. All needles were then removed from the patient. Sterile dressing was applied. IMPRESSION: Satisfactory core needle biopsy and aspiration of the left iliac bone marrow under fluoro guidance. Electronically Signed By: Cordella Banner On: 02/06/2024 11:11    Past Medical History:   Past Medical History:  Diagnosis Date  . Allergic state   . Anxiety   . Chest pain   . Essential hypertension, benign   . GERD (gastroesophageal reflux disease)   . History of chest pain    abnormal EKG, chest pain, shortness of breath, hypertension, hyperlipidemia, family history of cardiovascular disease  . History of foot sprain    left midfoot  . Hyperlipidemia   . Hypertension   . Motion sickness   . Other and unspecified angina pectoris ()   . Plantar fasciitis, left   . PONV (postoperative nausea and vomiting)   . Sleep apnea    Does not use CPAP machine    Past Surgical History:   Past Surgical History:  Procedure Laterality Date  . ROBOTIC ASSISTED UMBILICAL HERNIA REPAIR   08/26/2022   Dr Lucas Catchings  . excision of mass  08/26/2022   Dr Lucas Catchings --- Abd wall  . Removal of lipomas      Allergies:  No Known Allergies  Current Medications:   Prior to Admission medications  Medication Sig Taking? Last Dose  dextroamphetamine-amphetamine (ADDERALL XR) 30 MG  XR capsule Take 1 capsule (30 mg total) by mouth every morning for 30 days Yes Taking  dextroamphetamine-amphetamine (ADDERALL) 5 mg tablet Take 1 tablet (5 mg total) by mouth once daily for 30 days Yes Taking  escitalopram oxalate (LEXAPRO) 20 MG tablet TAKE 1 TABLET BY MOUTH EVERY DAY Yes Taking  gabapentin (NEURONTIN) 100 MG capsule Take 1 capsule (100 mg total) by mouth at bedtime for 30 days Yes Taking  lisinopriL (ZESTRIL) 20 MG tablet Take 1 tablet (20 mg total) by mouth  once daily for 180 days In am Yes Taking  omeprazole (PRILOSEC) 40 MG DR capsule Take 1 capsule (40 mg total) by mouth once daily Yes Taking  rosuvastatin (CRESTOR) 10 MG tablet Take 1 tablet (10 mg total) by mouth once daily Yes Taking    Family History:   Family History  Problem Relation Name Age of Onset  . Diabetes Mother mom   . High blood pressure (Hypertension) Mother mom   . Heart disease Father bill        early onset of cardiovascular disease and myocardial infarction  . Myocardial Infarction (Heart attack) Father bill     Social History:   Social History   Socioeconomic History  . Marital status: Married  Tobacco Use  . Smoking status: Former    Current packs/day: 0.00    Average packs/day: 1 pack/day for 10.0 years (10.0 ttl pk-yrs)    Types: Cigarettes    Start date: 32    Quit date: 2005    Years since quitting: 20.6  . Smokeless tobacco: Never  . Tobacco comments:    Remote tobacco use  Vaping Use  . Vaping status: Never Used  Substance and Sexual Activity  . Alcohol use: Not Currently    Comment: occ  . Drug use: No  . Sexual activity: Yes    Partners: Female  Social History Narrative   Married, lives with wife and one daughter and two sons. Another daughter is grown and out of the home. Associate's degree. Maintenance Production designer, theatre/television/film for a local company. No current exercise.    Social Drivers of Health   Financial Resource Strain: Low Risk  (06/13/2023)   Overall Financial Resource Strain (CARDIA)   . Difficulty of Paying Living Expenses: Not hard at all  Food Insecurity: No Food Insecurity (01/16/2024)   Received from [facility]   Hunger Vital Sign   . Within the past 12 months, you worried that your food would run out before you got the money to buy more.: Never true   . Within the past 12 months, the food you bought just didn't last and you didn't have money to get more.: Never true  Transportation Needs: No Transportation Needs (01/16/2024)    Received from [facility] - Transportation   . In the past 12 months, has lack of transportation kept you from medical appointments or from getting medications?: No   . In the past 12 months, has lack of transportation kept you from meetings, work, or from getting things needed for daily living?: No  Housing Stability: Unknown (08/01/2023)   Housing Stability Vital Sign   . Unable to Pay for Housing in the Last Year: No   . Homeless in the Last Year: No    Review of Systems:   A 10 point review of systems is negative, except for the pertinent positives and negatives detailed in the HPI.  Vitals:   Vitals:   02/23/24 1255  BP: 102/68  Pulse: 97  SpO2: 97%  Weight: 88.5 kg (195 lb)  Height: 180.3 cm (5' 11)     Body mass index is 27.2 kg/m.  Physical Exam:   Physical Exam Vitals and nursing note reviewed.  Constitutional:      General: He is not in acute distress.    Appearance: Normal appearance. He is not ill-appearing, toxic-appearing or diaphoretic.  HENT:     Head: Normocephalic and atraumatic.     Right Ear: External ear normal.     Left Ear: External ear normal.  Eyes:     General:        Right eye: No discharge.        Left eye: No discharge.     Conjunctiva/sclera: Conjunctivae normal.  Cardiovascular:     Rate and Rhythm: Normal rate and regular rhythm.     Pulses: Normal pulses.     Heart sounds: Normal heart sounds. No murmur heard.    No friction rub. No gallop.  Pulmonary:     Effort: Pulmonary effort is normal. No respiratory distress.     Breath sounds: Normal breath sounds. No stridor. No wheezing, rhonchi or rales.  Chest:     Chest wall: No tenderness.  Skin:    General: Skin is warm and dry.     Capillary Refill: Capillary refill takes less than 2 seconds.  Neurological:     Mental Status: He is alert.     Assessment and Plan:  No results found for this visit on 02/23/24.  Diagnoses and all orders for this visit:  Pre-op  exam -     ECG 12-lead   Assessment & Plan Right knee lateral meniscus tear Confirmed by MRI, causing significant pain due to the meniscus flap getting caught in the joint. Preoperative clearance completed with a good EKG result. Surgery scheduled for 02/26/24. - Proceed with scheduled knee surgery on Monday - Send necessary paperwork to the surgical team  Anxiety disorder Severe anxiety with panic attacks and difficulty sleeping, exacerbated by recent medical issues and healthcare system's handling of test results. Anxiety impacts daily activities and interests, including paragliding. - Use Xanax  as needed for acute anxiety episodes  Follow up He will call the office with any new or worsening symptoms.  This note has been created using automated tools and reviewed for accuracy by provider.  Patient received an After Visit Summary    Attestation Statement:   I personally performed the service, non-incident to. (WP)   GLENDA MACARIO HADDOCK, NP

## 2024-02-26 DIAGNOSIS — F4320 Adjustment disorder, unspecified: Secondary | ICD-10-CM | POA: Insufficient documentation

## 2024-02-26 NOTE — Telephone Encounter (Signed)
 Noted. Nothing further needed.

## 2024-02-27 ENCOUNTER — Telehealth: Payer: Self-pay

## 2024-02-27 ENCOUNTER — Encounter
Admission: RE | Disposition: A | Discharge status: Home / Self Care | Payer: Self-pay | Source: Home / Self Care | Attending: Student in an Organized Health Care Education/Training Program

## 2024-02-27 ENCOUNTER — Ambulatory Visit: Admitting: Certified Registered"

## 2024-02-27 ENCOUNTER — Ambulatory Visit

## 2024-02-27 ENCOUNTER — Ambulatory Visit
Admission: RE | Admit: 2024-02-27 | Discharge: 2024-02-27 | Disposition: A | Discharge status: Home / Self Care | Attending: Student in an Organized Health Care Education/Training Program | Admitting: Student in an Organized Health Care Education/Training Program

## 2024-02-27 ENCOUNTER — Encounter: Payer: Self-pay | Admitting: Student in an Organized Health Care Education/Training Program

## 2024-02-27 ENCOUNTER — Other Ambulatory Visit: Payer: Self-pay

## 2024-02-27 DIAGNOSIS — K219 Gastro-esophageal reflux disease without esophagitis: Secondary | ICD-10-CM | POA: Diagnosis not present

## 2024-02-27 DIAGNOSIS — R0489 Hemorrhage from other sites in respiratory passages: Secondary | ICD-10-CM | POA: Insufficient documentation

## 2024-02-27 DIAGNOSIS — E7800 Pure hypercholesterolemia, unspecified: Secondary | ICD-10-CM | POA: Diagnosis not present

## 2024-02-27 DIAGNOSIS — Z87891 Personal history of nicotine dependence: Secondary | ICD-10-CM | POA: Diagnosis not present

## 2024-02-27 DIAGNOSIS — R911 Solitary pulmonary nodule: Secondary | ICD-10-CM | POA: Insufficient documentation

## 2024-02-27 DIAGNOSIS — I10 Essential (primary) hypertension: Secondary | ICD-10-CM | POA: Insufficient documentation

## 2024-02-27 DIAGNOSIS — G4733 Obstructive sleep apnea (adult) (pediatric): Secondary | ICD-10-CM | POA: Insufficient documentation

## 2024-02-27 HISTORY — PX: VIDEO BRONCHOSCOPY WITH ENDOBRONCHIAL NAVIGATION: SHX6175

## 2024-02-27 SURGERY — VIDEO BRONCHOSCOPY WITH ENDOBRONCHIAL NAVIGATION
Anesthesia: General | Laterality: Bilateral

## 2024-02-27 MED ORDER — PHENYLEPHRINE HCL-NACL 20-0.9 MG/250ML-% IV SOLN
INTRAVENOUS | Status: DC | PRN
  Administered 2024-02-27 (×2): 25 ug/min via INTRAVENOUS

## 2024-02-27 MED ORDER — OXYCODONE HCL 5 MG PO TABS: 5.0000 mg | ORAL_TABLET | Freq: Once | ORAL | Status: DC | PRN

## 2024-02-27 MED ORDER — PROPOFOL 10 MG/ML IV BOLUS
INTRAVENOUS | Status: DC | PRN
  Administered 2024-02-27: 200 mg via INTRAVENOUS
  Administered 2024-02-27 (×2): 150 ug/kg/min via INTRAVENOUS
  Administered 2024-02-27: 200 mg via INTRAVENOUS

## 2024-02-27 MED ORDER — OXYCODONE HCL 5 MG/5ML PO SOLN: 5.0000 mg | Freq: Once | ORAL | Status: DC | PRN

## 2024-02-27 MED ORDER — LIDOCAINE HCL (CARDIAC) PF 100 MG/5ML IV SOSY
PREFILLED_SYRINGE | INTRAVENOUS | Status: DC | PRN
  Administered 2024-02-27 (×2): 100 mg via INTRAVENOUS

## 2024-02-27 MED ORDER — SUGAMMADEX SODIUM 200 MG/2ML IV SOLN
INTRAVENOUS | Status: DC | PRN
  Administered 2024-02-27 (×2): 200 mg via INTRAVENOUS

## 2024-02-27 MED ORDER — FENTANYL CITRATE (PF) 100 MCG/2ML IJ SOLN
INTRAMUSCULAR | Status: AC
  Filled 2024-02-27: qty 2

## 2024-02-27 MED ORDER — DEXMEDETOMIDINE HCL IN NACL 80 MCG/20ML IV SOLN
INTRAVENOUS | Status: DC | PRN
  Administered 2024-02-27 (×2): 20 ug via INTRAVENOUS

## 2024-02-27 MED ORDER — ONDANSETRON HCL 4 MG/2ML IJ SOLN
INTRAMUSCULAR | Status: DC | PRN
  Administered 2024-02-27 (×2): 4 mg via INTRAVENOUS

## 2024-02-27 MED ORDER — ROCURONIUM BROMIDE 10 MG/ML (PF) SYRINGE
PREFILLED_SYRINGE | INTRAVENOUS | Status: AC
  Filled 2024-02-27: qty 20

## 2024-02-27 MED ORDER — FENTANYL CITRATE (PF) 100 MCG/2ML IJ SOLN: 25.0000 ug | INTRAMUSCULAR | Status: DC | PRN

## 2024-02-27 MED ORDER — PROPOFOL 10 MG/ML IV BOLUS
INTRAVENOUS | Status: AC
  Filled 2024-02-27: qty 20

## 2024-02-27 MED ORDER — PROPOFOL 1000 MG/100ML IV EMUL
INTRAVENOUS | Status: AC
  Filled 2024-02-27: qty 100

## 2024-02-27 MED ORDER — CHLORHEXIDINE GLUCONATE 0.12 % MT SOLN
15.0000 mL | Freq: Once | OROMUCOSAL | Status: AC
  Administered 2024-02-27 (×2): 15 mL via OROMUCOSAL

## 2024-02-27 MED ORDER — ROCURONIUM BROMIDE 100 MG/10ML IV SOLN
INTRAVENOUS | Status: DC | PRN
Start: 2024-02-27 — End: 2024-02-27
  Administered 2024-02-27: 20 mg via INTRAVENOUS
  Administered 2024-02-27 (×2): 60 mg via INTRAVENOUS
  Administered 2024-02-27: 20 mg via INTRAVENOUS

## 2024-02-27 MED ORDER — ORAL CARE MOUTH RINSE: 15.0000 mL | Freq: Once | OROMUCOSAL | Status: AC

## 2024-02-27 MED ORDER — LABETALOL HCL 5 MG/ML IV SOLN
INTRAVENOUS | Status: DC | PRN
  Administered 2024-02-27 (×2): 10 mg via INTRAVENOUS

## 2024-02-27 MED ORDER — FENTANYL CITRATE (PF) 100 MCG/2ML IJ SOLN
INTRAMUSCULAR | Status: DC | PRN
  Administered 2024-02-27 (×4): 50 ug via INTRAVENOUS

## 2024-02-27 MED ORDER — CHLORHEXIDINE GLUCONATE 0.12 % MT SOLN
OROMUCOSAL | Status: AC
  Filled 2024-02-27: qty 15

## 2024-02-27 MED ORDER — DEXAMETHASONE SODIUM PHOSPHATE 10 MG/ML IJ SOLN
INTRAMUSCULAR | Status: DC | PRN
Start: 2024-02-27 — End: 2024-02-27
  Administered 2024-02-27 (×2): 10 mg via INTRAVENOUS

## 2024-02-27 MED ORDER — LACTATED RINGERS IV SOLN: INTRAVENOUS | Status: DC

## 2024-02-27 NOTE — Transfer of Care (Signed)
 Immediate Anesthesia Transfer of Care Note  Patient: Jay Trujillo  Procedure(s) Performed: VIDEO BRONCHOSCOPY WITH ENDOBRONCHIAL NAVIGATION (Bilateral)  Patient Location: PACU  Anesthesia Type:General  Level of Consciousness: drowsy and patient cooperative  Airway & Oxygen Therapy: Patient Spontanous Breathing  Post-op Assessment: Report given to RN and Post -op Vital signs reviewed and stable  Post vital signs: stable  Last Vitals:  Vitals Value Taken Time  BP 108/85 02/27/24 13:26  Temp    Pulse 84 02/27/24 13:29  Resp 27 02/27/24 13:29  SpO2 97 % 02/27/24 13:29  Vitals shown include unfiled device data.  Last Pain:  Vitals:   02/27/24 1113  TempSrc: Temporal  PainSc: 3          Complications: No notable events documented.

## 2024-02-27 NOTE — Procedures (Signed)
 Procedure Note  Patient: Jay Trujillo  GE 3D [facility] mobile C-arm was utilized to identify and biopsy LUL nodule.  Needle-in-lesion was confirmed using real-time GE 3D OEC imaging, and images were uploaded to PACS.   Belva November, MD [facility] 02/27/2024 1:21 PM

## 2024-02-27 NOTE — Telephone Encounter (Signed)
 Copied from CRM #[id number]. Topic: Appointments - Scheduling Inquiry for Clinic >> Feb 27, 2024  1:49 PM Jay Trujillo wrote: Reason for CRM: Patient is due for a 2 week follow up from Dr. Isadora and bronchoscopy. Patient had procedure today and need to be scheduled, PAS unable to schedule due to availability.  Please schedule with patient.  02/27/2024 Called pt no answer unable to leave VM

## 2024-02-27 NOTE — Anesthesia Preprocedure Evaluation (Signed)
 Anesthesia Evaluation  Patient identified by MRN, date of birth, ID band Patient awake    Reviewed: Allergy & Precautions, NPO status , Patient's Chart, lab work & pertinent test results  History of Anesthesia Complications (+) PONV and history of anesthetic complications  Airway Mallampati: III  TM Distance: >3 FB Neck ROM: full    Dental no notable dental hx.    Pulmonary sleep apnea , former smoker   Pulmonary exam normal        Cardiovascular hypertension, On Medications negative cardio ROS Normal cardiovascular exam     Neuro/Psych negative neurological ROS  negative psych ROS   GI/Hepatic Neg liver ROS,GERD  Medicated and Controlled,,  Endo/Other  negative endocrine ROS    Renal/GU      Musculoskeletal   Abdominal   Peds  Hematology negative hematology ROS (+)   Anesthesia Other Findings Past Medical History: 08/23/2022: Achilles tendinitis of left lower extremity 03/14/2011: Allergic rhinitis 12/09/2011: Back pain 07/25/2023: Chest pain 07/25/2017: Chronic insomnia     Comment:  Sleep walking on ambien. Trazodone started 1/19   No date: GERD (gastroesophageal reflux disease) 02/10/2011: HTN (hypertension) No date: Hypercholesteremia 02/28/2019: Hyperlipidemia No date: Hypertension No date: Lipoma of abdominal wall     Comment:  multiple No date: Low testosterone in male 09/08/2017: OSA (obstructive sleep apnea) No date: PONV (postoperative nausea and vomiting) 01/20/2017: Skin mass No date: Sleep apnea     Comment:  cannot tolerate cpap No date: Umbilical hernia  Past Surgical History: 2023: HERNIA REPAIR; Right 02/06/2024: IR BONE MARROW BIOPSY & ASPIRATION No date: LIPOMA EXCISION     Comment:  x2 08/26/2022: LIPOMA EXCISION; N/A     Comment:  Procedure: EXCISION LIPOMA;  Surgeon: Rodolph Romano, MD;  Location: [facility] ORS;  Service: General;                Laterality:  N/A;     Reproductive/Obstetrics negative OB ROS                              Anesthesia Physical Anesthesia Plan  ASA: 2  Anesthesia Plan: General ETT   Post-op Pain Management: Toradol  IV (intra-op)* and Ofirmev  IV (intra-op)*   Induction: Intravenous  PONV Risk Score and Plan: 3 and Ondansetron , Dexamethasone , Midazolam , Treatment may vary due to age or medical condition, Propofol  infusion and TIVA  Airway Management Planned: Oral ETT  Additional Equipment:   Intra-op Plan:   Post-operative Plan: Extubation in OR  Informed Consent: I have reviewed the patients History and Physical, chart, labs and discussed the procedure including the risks, benefits and alternatives for the proposed anesthesia with the patient or authorized representative who has indicated his/her understanding and acceptance.     Dental Advisory Given  Plan Discussed with: Anesthesiologist, CRNA and Surgeon  Anesthesia Plan Comments: (Patient consented for risks of anesthesia including but not limited to:  - adverse reactions to medications - damage to eyes, teeth, lips or other oral mucosa - nerve damage due to positioning  - sore throat or hoarseness - Damage to heart, brain, nerves, lungs, other parts of body or loss of life  Patient voiced understanding and assent.)         Anesthesia Quick Evaluation

## 2024-02-27 NOTE — Op Note (Signed)
 Video Bronchoscopy with Robotic Assisted Bronchoscopic Navigation   Date of Operation: 02/27/2024   Pre-op Diagnosis: lung nodule  Surgeon: Belva November, MD  Anesthesia: General endotracheal anesthesia  Operation: Flexible video fiberoptic bronchoscopy with robotic assistance and biopsies.  Estimated Blood Loss: Minimal  Complications: None  Indications and History: Jay Trujillo is a 53 y.o. male with history of smoldering myeloma presenting for a LUL nodule biopsy.  Recommendation made to achieve a tissue diagnosis via robotic assisted navigational bronchoscopy.  The risks, benefits, complications, treatment options and expected outcomes were discussed with the patient.  The possibilities of pneumothorax, pneumonia, reaction to medication, pulmonary aspiration, perforation of a viscus, bleeding, failure to diagnose a condition and creating a complication requiring transfusion or operation were discussed with the patient who freely signed the consent.    Description of Procedure: The patient was seen in the Preoperative Area, was examined and was deemed appropriate to proceed.  The patient was taken to [facility] procedure room, identified as Jay Trujillo and the procedure verified as Flexible Video Fiberoptic Bronchoscopy.  A Time Out was held and the above information confirmed.   Prior to the date of the procedure a high-resolution CT scan of the chest was performed. Utilizing ION software program a virtual tracheobronchial tree was generated to allow the creation of distinct navigation pathways to the patient's parenchymal abnormalities. After being taken to the operating room general anesthesia was initiated and the patient  was orally intubated. The video fiberoptic bronchoscope was introduced via the endotracheal tube and a general inspection was performed which showed normal right and left lung anatomy. Aspiration of the bilateral mainstems was completed to remove any  remaining secretions. Robotic catheter inserted into patient's endotracheal tube.   Target #1 LUL nodule: The distinct navigation pathways prepared prior to this procedure were then utilized to navigate to patient's lesion identified on CT scan. The robotic catheter was secured into place and the vision probe was withdrawn.  Lesion location was approximated using fluoroscopy.  Local registration and targeting was performed using GE 3D OEC mobile C-arm three-dimensional imaging. Under fluoroscopic guidance transbronchial brushings, transbronchial needle biopsies, and transbronchial forceps biopsies were performed to be sent for cytology and pathology.  Needle-in-lesion was confirmed using GE mobile C-arm.  A bronchioalveolar lavage was performed in the LUL and sent for cytology.   Tool in lesion:    EBUS: The EBUS bronchoscope was then introduced and the hilar and mediastinal lymph node stations were examined. No enlarged lymph nodes were encountered, and stations 11L, 4L, 7, 4R, and 11R were examined and noted to be within normal. No biopsy was obtained.  At the end of the procedure a general airway inspection was performed and there was no evidence of active bleeding. The bronchoscope was removed.  The patient tolerated the procedure well. There was no significant blood loss and there were no obvious complications. A post-procedural chest x-ray is pending.  Samples Target #1: LUL nodule 1. Transbronchial needle brushings from LUL nodule 2. Transbronchial Wang needle biopsies from LUL nodule 3. Transbronchial forceps biopsies from LUL nodule 4. Bronchoalveolar lavage from LUL  Plans:  The patient will be discharged from the PACU to home when recovered from anesthesia and after chest x-ray is reviewed. We will review the cytology, pathology and microbiology results with the patient when they become available. Outpatient followup will be with myself.  Belva November, MD [facility] 02/27/2024 1:19 PM

## 2024-02-27 NOTE — Anesthesia Postprocedure Evaluation (Signed)
 Anesthesia Post Note  Patient: Industrial/product designer  Procedure(s) Performed: VIDEO BRONCHOSCOPY WITH ENDOBRONCHIAL NAVIGATION (Bilateral)  Patient location during evaluation: PACU Anesthesia Type: General Level of consciousness: awake and alert Pain management: pain level controlled Vital Signs Assessment: post-procedure vital signs reviewed and stable Respiratory status: spontaneous breathing, nonlabored ventilation, respiratory function stable and patient connected to nasal cannula oxygen Cardiovascular status: blood pressure returned to baseline and stable Postop Assessment: no apparent nausea or vomiting Anesthetic complications: no   There were no known notable events for this encounter.   Last Vitals:  Vitals:   02/27/24 1400 02/27/24 1415  BP: 104/77 108/81  Pulse: 72 72  Resp: 16 17  Temp: 36.7 C (!) 36.1 C  SpO2: 99% 99%    Last Pain:  Vitals:   02/27/24 1415  TempSrc: Temporal  PainSc: 0-No pain                 Lendia LITTIE Mae

## 2024-02-27 NOTE — Anesthesia Procedure Notes (Signed)
 Procedure Name: Intubation Date/Time: 02/27/2024 12:42 PM  Performed by: Norleen Alberta HERO., CRNAPre-anesthesia Checklist: Patient identified, Patient being monitored, Timeout performed, Emergency Drugs available and Suction available Patient Re-evaluated:Patient Re-evaluated prior to induction Oxygen Delivery Method: Circle system utilized Preoxygenation: Pre-oxygenation with 100% oxygen Induction Type: IV induction Ventilation: Mask ventilation without difficulty Laryngoscope Size: McGrath and 4 Grade View: Grade I Tube type: Oral Tube size: 9.0 mm Number of attempts: 1 Airway Equipment and Method: Stylet Placement Confirmation: ETT inserted through vocal cords under direct vision, positive ETCO2 and breath sounds checked- equal and bilateral Secured at: 21 cm Tube secured with: Tape Dental Injury: Teeth and Oropharynx as per pre-operative assessment

## 2024-02-27 NOTE — Interval H&P Note (Signed)
 The patient is here for robotic assisted navigational bronchoscopy with EBUS/TBNA for hilar and mediastinal staging.  We discussed the importance of diagnosis and staging in lung malignancies, and the approach to obtaining a tissue diagnosis which would include robotic assisted navigational bronchoscopy with endobronchial ultrasound guided sampling.  We also discussed the risks associated with the procedure which include a 2% risk of pneumothorax, infection, bleeding, and nondiagnostic procedure in detail.  I explained that patients typically are able to return home the same day of the procedure, but in rare cases admission to the hospital for observation and treatment is required.   Patient is appropriate for the procedure; all the questions were answered. He agrees to proceed.  Belva November, MD [facility] 02/27/2024 12:11 PM

## 2024-02-28 ENCOUNTER — Ambulatory Visit: Payer: Self-pay | Admitting: Student in an Organized Health Care Education/Training Program

## 2024-02-28 ENCOUNTER — Encounter: Payer: Self-pay | Admitting: Student in an Organized Health Care Education/Training Program

## 2024-02-28 LAB — CYTOLOGY - NON PAP

## 2024-03-04 ENCOUNTER — Ambulatory Visit: Admitting: Oncology

## 2024-03-04 LAB — SURGICAL PATHOLOGY

## 2024-03-11 ENCOUNTER — Ambulatory Visit

## 2024-03-15 ENCOUNTER — Ambulatory Visit: Admitting: Oncology

## 2024-03-19 ENCOUNTER — Encounter: Payer: Self-pay | Admitting: Oncology

## 2024-03-19 ENCOUNTER — Inpatient Hospital Stay: Attending: Oncology | Admitting: Oncology

## 2024-03-19 VITALS — BP 135/90 | HR 106 | Temp 97.4°F | Resp 16 | Wt 191.0 lb

## 2024-03-19 DIAGNOSIS — Z87891 Personal history of nicotine dependence: Secondary | ICD-10-CM | POA: Diagnosis not present

## 2024-03-19 DIAGNOSIS — D472 Monoclonal gammopathy: Secondary | ICD-10-CM | POA: Insufficient documentation

## 2024-03-19 DIAGNOSIS — Z803 Family history of malignant neoplasm of breast: Secondary | ICD-10-CM | POA: Insufficient documentation

## 2024-03-19 DIAGNOSIS — R918 Other nonspecific abnormal finding of lung field: Secondary | ICD-10-CM | POA: Diagnosis not present

## 2024-03-19 DIAGNOSIS — R911 Solitary pulmonary nodule: Secondary | ICD-10-CM | POA: Insufficient documentation

## 2024-03-19 NOTE — Progress Notes (Signed)
 Hematology/Oncology Consult note [facility]  Telephone:([phone] [phone]  Patient Care Team: Cyrus Selinda Moose, PA-C as PCP - General (Family Medicine) Melanee Annah BROCKS, MD as Consulting Physician (Oncology)   Name of the patient: Jay Trujillo  [id number]  1971/05/09   Date of visit: 03/19/24  Diagnosis- 1.  Smoldering multiple myeloma 2.  Left upper lobe lung mass of unclear etiology.  Bronchoscopy negative  Chief complaint/ Reason for visit-discuss bronchoscopy results and further management  Heme/Onc history: patient is a 53 year old male with a past medical history significant for hypertension hyperlipidemia GERD and anxiety who was seen by Dr. Maree from neurology for evaluation of numbness in his extremities.  As a part of his workup patient underwent SPEP Which incidentally showed IgG kappa M protein of 0.9 g.  Patient has been referred for the same.  Patient admits to being under stress with his day-to-day life but is otherwise very active.  Occasional joint stiffness which is self-limited.  Denies any changes in his appetite or weight.    Results of myeloma workup from 01/16/2024 were as follows: CBC was normal with an H&H of 15.4/44.6. CMP was normal with a creatinine of 0.98. Calcium mildly low at 8.6 with a total protein of 7. Myeloma panel showed 0.8 g of IgG kappa monoclonal protein. Serum free kappa light chain elevated at 205 with a free light chain ratio of 26.6. Random urine protein electrophoresis did not show any evidence of M protein    Bone marrow biopsy on 02/06/2024 showed hypercellular bone marrow involved with kappa restricted plasma cell neoplasm at approximately 15% overall and mild megakaryocytic hyperplasia.  Thrombocytosis could be reactive.  [facility] FISH panel was negative for BCR-ABL on the bone marrow.  FISH for myeloma showed borderline positivity for translocation 11:14.  Overall patient falls in the category  of intermediate risk smoldering multiple myeloma.Intermediate risk (one factor present) - Estimated median TTP 68 months; of the initial cohort, 15 percent progressed per year during the first two years, 7 percent per year during the next three years, and 4 percent per year during the next five years. Risk of progression was 26 percent at 2 years, 47 percent at 5 years, and 65 percent at 10 years.    PET CT scan on 02/09/2024 did not show any evidence of lytic lesions from myeloma.  Incidentally he was found to have a bilobed left upper lobe lung lesion measuring 2.1 x 1 cm with an SUV of 4.8.  Patient was seen by Dr. Isadora and underwent bronchoscopy which was negative for malignancy  Interval history-patient is doing well presently and denies any specific complaints at this time  ECOG PS- 0 Pain scale- 0   Review of systems- Review of Systems  Constitutional:  Negative for chills, fever, malaise/fatigue and weight loss.  HENT:  Negative for congestion, ear discharge and nosebleeds.   Eyes:  Negative for blurred vision.  Respiratory:  Negative for cough, hemoptysis, sputum production, shortness of breath and wheezing.   Cardiovascular:  Negative for chest pain, palpitations, orthopnea and claudication.  Gastrointestinal:  Negative for abdominal pain, blood in stool, constipation, diarrhea, heartburn, melena, nausea and vomiting.  Genitourinary:  Negative for dysuria, flank pain, frequency, hematuria and urgency.  Musculoskeletal:  Negative for back pain, joint pain and myalgias.  Skin:  Negative for rash.  Neurological:  Negative for dizziness, tingling, focal weakness, seizures, weakness and headaches.  Endo/Heme/Allergies:  Does not bruise/bleed easily.  Psychiatric/Behavioral:  Negative for depression and suicidal ideas. The patient does not have insomnia.       No Known Allergies   Past Medical History:  Diagnosis Date   Achilles tendinitis of left lower extremity 08/23/2022    Allergic rhinitis 03/14/2011   Back pain 12/09/2011   Chest pain 07/25/2023   Chronic insomnia 07/25/2017   Sleep walking on ambien. Trazodone started 1/19     GERD (gastroesophageal reflux disease)    HTN (hypertension) 02/10/2011   Hypercholesteremia    Hyperlipidemia 02/28/2019   Hypertension    Lipoma of abdominal wall    multiple   Low testosterone in male    OSA (obstructive sleep apnea) 09/08/2017   PONV (postoperative nausea and vomiting)    Skin mass 01/20/2017   Sleep apnea    cannot tolerate cpap   Umbilical hernia      Past Surgical History:  Procedure Laterality Date   HERNIA REPAIR Right 2023   IR BONE MARROW BIOPSY & ASPIRATION  02/06/2024   LIPOMA EXCISION     x2   LIPOMA EXCISION N/A 08/26/2022   Procedure: EXCISION LIPOMA;  Surgeon: Rodolph Romano, MD;  Location: [facility] ORS;  Service: General;  Laterality: N/A;   VIDEO BRONCHOSCOPY WITH ENDOBRONCHIAL NAVIGATION Bilateral 02/27/2024   Procedure: VIDEO BRONCHOSCOPY WITH ENDOBRONCHIAL NAVIGATION;  Surgeon: Isadora Hose, MD;  Location: [facility] ORS;  Service: Pulmonary;  Laterality: Bilateral;    Social History   Socioeconomic History   Marital status: Married    Spouse name: Not on file   Number of children: Not on file   Years of education: Not on file   Highest education level: Not on file  Occupational History   Not on file  Tobacco Use   Smoking status: Former    Current packs/day: 0.00    Types: Cigarettes    Quit date: 2003    Years since quitting: 22.6    Passive exposure: Never   Smokeless tobacco: Never  Vaping Use   Vaping status: Never Used  Substance and Sexual Activity   Alcohol use: Yes    Comment: socially   Drug use: No   Sexual activity: Yes  Other Topics Concern   Not on file  Social History Narrative   Not on file   Social Drivers of Health   Financial Resource Strain: Low Risk  (06/13/2023)   Received from [facility]   Overall Financial Resource  Strain (CARDIA)    Difficulty of Paying Living Expenses: Not hard at all  Food Insecurity: No Food Insecurity (01/16/2024)   Hunger Vital Sign    Worried About Running Out of Food in the Last Year: Never true    Ran Out of Food in the Last Year: Never true  Transportation Needs: No Transportation Needs (01/16/2024)   PRAPARE - Administrator, Civil Service (Medical): No    Lack of Transportation (Non-Medical): No  Physical Activity: Not on file  Stress: Not on file  Social Connections: Not on file  Intimate Partner Violence: Not At Risk (01/16/2024)   Humiliation, Afraid, Rape, and Kick questionnaire    Fear of Current or Ex-Partner: No    Emotionally Abused: No    Physically Abused: No    Sexually Abused: No    Family History  Problem Relation Age of Onset   Diabetes Mother    Heart Problems Father    Breast cancer Maternal Grandmother      Current Outpatient Medications:    ALPRAZolam  (  XANAX ) 0.5 MG tablet, Take 1 tablet (0.5 mg total) by mouth every 8 (eight) hours as needed for anxiety., Disp: 10 tablet, Rfl: 0   amphetamine-dextroamphetamine (ADDERALL XR) 30 MG 24 hr capsule, Take 30 mg by mouth every morning., Disp: , Rfl:    escitalopram (LEXAPRO) 10 MG tablet, Take 20 mg by mouth daily., Disp: , Rfl:    HYDROcodone -acetaminophen  (NORCO/VICODIN) 5-325 MG tablet, Take 2 tablets by mouth every 8 (eight) hours as needed., Disp: 14 tablet, Rfl: 0   lisinopril (ZESTRIL) 20 MG tablet, Take 1 tablet by mouth every morning., Disp: , Rfl:    omeprazole (PRILOSEC OTC) 20 MG tablet, Take 20 mg by mouth as needed., Disp: , Rfl:    ondansetron  (ZOFRAN -ODT) 4 MG disintegrating tablet, Take 1 tablet (4 mg total) by mouth every 6 (six) hours as needed for nausea or vomiting., Disp: 20 tablet, Rfl: 0   rosuvastatin (CRESTOR) 10 MG tablet, Take 1 tablet by mouth every morning., Disp: , Rfl:   Physical exam:  Vitals:   03/19/24 1010  BP: (!) 135/90  Pulse: (!) 106  Resp: 16   Temp: (!) 97.4 F (36.3 C)  TempSrc: Tympanic  SpO2: 100%  Weight: 191 lb (86.6 kg)   Physical Exam Cardiovascular:     Rate and Rhythm: Normal rate and regular rhythm.     Heart sounds: Normal heart sounds.  Pulmonary:     Effort: Pulmonary effort is normal.     Breath sounds: Normal breath sounds.  Abdominal:     General: Bowel sounds are normal.     Palpations: Abdomen is soft.  Skin:    General: Skin is warm and dry.  Neurological:     Mental Status: He is alert and oriented to person, place, and time.      I have personally reviewed labs listed below:    Latest Ref Rng & Units 01/16/2024    2:46 PM  CMP  Glucose 70 - 99 mg/dL 95   BUN 6 - 20 mg/dL 16   Creatinine 9.38 - 1.24 mg/dL 9.01   Sodium 864 - 854 mmol/L 132   Potassium 3.5 - 5.1 mmol/L 4.3   Chloride 98 - 111 mmol/L 101   CO2 22 - 32 mmol/L 26   Calcium 8.9 - 10.3 mg/dL 8.6   Total Protein 6.5 - 8.1 g/dL 7.0   Total Bilirubin 0.0 - 1.2 mg/dL 0.7   Alkaline Phos 38 - 126 U/L 70   AST 15 - 41 U/L 23   ALT 0 - 44 U/L 26       Latest Ref Rng & Units 02/06/2024    9:21 AM  CBC  WBC 4.0 - 10.5 K/uL 4.0   Hemoglobin 13.0 - 17.0 g/dL 84.1   Hematocrit 60.9 - 52.0 % 48.3   Platelets 150 - 400 K/uL 536    I have personally reviewed Radiology images listed below: No images are attached to the encounter.  CT SUPER D CHEST WO CONTRAST Result Date: 03/08/2024 CLINICAL DATA:  Preop bronchoscopy, myeloma, lung nodule * Tracking Code: BO * EXAM: CT CHEST WITHOUT CONTRAST TECHNIQUE: Multidetector CT imaging of the chest was performed using thin slice collimation for electromagnetic bronchoscopy planning purposes, without intravenous contrast. RADIATION DOSE REDUCTION: This exam was performed according to the departmental dose-optimization program which includes automated exposure control, adjustment of the mA and/or kV according to patient size and/or use of iterative reconstruction technique. COMPARISON:  PET-CT,  02/05/2024 FINDINGS: Cardiovascular: No significant  extracardiac vascular findings. Normal heart size. Left coronary artery calcifications. No pericardial effusion. Mediastinum/Nodes: No enlarged mediastinal, hilar, or axillary lymph nodes. Thyroid gland, trachea, and esophagus demonstrate no significant findings. Lungs/Pleura: Elongated, bilobed spiculated nodule of the peripheral posterior left upper lobe measuring 2.2 x 0.9 cm (series 3, image 53). Additional scattered small nodules, for example a 0.5 cm nodule of the medial posterior left upper lobe (series 3, image 41). Irregular scarring in the lingula (series 3, image 104). Background of minimal emphysema and fine centrilobular nodularity, most concentrated in the lung apices. No pleural effusion or pneumothorax. Upper Abdomen: No acute abnormality. Musculoskeletal: No chest wall abnormality. No acute osseous findings. IMPRESSION: 1. Elongated, bilobed spiculated nodule of the peripheral posterior left upper lobe measuring 2.2 x 0.9 cm, previously PET avid and highly suspicious for primary bronchogenic malignancy. 2. Additional scattered small nodules measuring 0.5 cm and smaller, likely benign and incidental. 3. Background of minimal emphysema and smoking-related respiratory bronchiolitis. 4. Coronary artery disease. Emphysema (ICD10-J43.9). Electronically Signed   By: Marolyn JONETTA Jaksch M.D.   On: 03/08/2024 07:10   DG Chest Port 1 View Result Date: 02/27/2024 CLINICAL DATA:  Status post bronchoscopy. EXAM: PORTABLE CHEST 1 VIEW COMPARISON:  05/21/2023.  CT, 02/23/2024. FINDINGS: Patchy airspace and ill-defined nodular opacity noted in the lateral left upper lobe, more extensive than the small nodular opacities noted on the recent CT. Remainder of the lungs is clear. No pneumothorax or pleural effusion. Cardiac silhouette normal in size and configuration. Normal mediastinal and hilar contours. IMPRESSION: 1. Left upper lobe opacity larger in extent than the  small nodules noted on the recent CT. Findings suggest mild post procedure hemorrhage. No other evidence of a procedure complication. No pneumothorax. Electronically Signed   By: Alm Parkins M.D.   On: 02/27/2024 14:37   DG C-Arm 1-60 Min-No Report Result Date: 02/27/2024 Fluoroscopy was utilized by the requesting physician.  No radiographic interpretation.     Assessment and plan- Patient is a 53 y.o. 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  Will check CBC with differential CMP myeloma panel and serum free light chains in 3 months and see him 2 weeks after labs  2.  2.1 cm left upper lobe lung mass with an SUV of 4.8 noted on PET scan.  Bronchoscopy negative for malignancy.  He will continue to follow-up with pulmonary and they are planning to get a repeat CT chest soon.  I will defer any surgical referral to pulmonary at this time   Visit Diagnosis 1. Smoldering multiple myeloma   2. Mass of upper lobe of left lung      Dr. Annah Skene, MD, MPH [facility] at [facility] [phone] 03/19/2024 4:03 PM

## 2024-03-26 NOTE — Telephone Encounter (Signed)
 Copied from CRM [id number]. Topic: Appointments - Scheduling Inquiry for Clinic >> Mar 26, 2024  2:28 PM Jay Trujillo wrote: Reason for CRM: Patient would like to confirm with Dr.Dgayli if his appointment for tomorrow is urgent - he wants to cancel/reschedule, but wants providers opinion first.   Callback number: [phone]

## 2024-03-27 ENCOUNTER — Ambulatory Visit: Admitting: Student in an Organized Health Care Education/Training Program

## 2024-04-15 DIAGNOSIS — G8918 Other acute postprocedural pain: Secondary | ICD-10-CM | POA: Insufficient documentation

## 2024-04-18 ENCOUNTER — Ambulatory Visit: Admitting: Student in an Organized Health Care Education/Training Program

## 2024-04-18 ENCOUNTER — Encounter: Payer: Self-pay | Admitting: Student in an Organized Health Care Education/Training Program

## 2024-04-19 DIAGNOSIS — M25662 Stiffness of left knee, not elsewhere classified: Secondary | ICD-10-CM | POA: Insufficient documentation

## 2024-05-10 NOTE — Progress Notes (Signed)
 Discharge Plan Member has been discharged from [facility]. Please see below Discharge Plan for details.  If you have any questions, please contact our Clinical Team at [phone].  Name: Jay Trujillo Date of Birth:05-Jun-1971 Discharge Date: 2024-05-10 Discharged Reason: Not Engaged Referring Provider: Fonda Mower Psychiatric Medication Recommendation Transition: N/A (no CC med recs made) Referrals: No referrals Discharge Summary: An initial behavioral health evaluation did not occur. Unable to reach the Member to reschedule. [place] Care Provider: Kedric Severin - Health Coach

## 2024-05-17 NOTE — Telephone Encounter (Signed)
 Encounter opened in error.

## 2024-06-18 ENCOUNTER — Inpatient Hospital Stay

## 2024-06-18 ENCOUNTER — Telehealth: Payer: Self-pay | Admitting: Oncology

## 2024-06-18 ENCOUNTER — Other Ambulatory Visit

## 2024-06-18 NOTE — Telephone Encounter (Signed)
 Pt called to r/s lab - r/s w/pt and confirmed date/time - LH

## 2024-06-19 ENCOUNTER — Inpatient Hospital Stay: Attending: Oncology

## 2024-06-19 DIAGNOSIS — Z803 Family history of malignant neoplasm of breast: Secondary | ICD-10-CM | POA: Insufficient documentation

## 2024-06-19 DIAGNOSIS — D75839 Thrombocytosis, unspecified: Secondary | ICD-10-CM | POA: Diagnosis not present

## 2024-06-19 DIAGNOSIS — D472 Monoclonal gammopathy: Secondary | ICD-10-CM | POA: Insufficient documentation

## 2024-06-19 DIAGNOSIS — Z87891 Personal history of nicotine dependence: Secondary | ICD-10-CM | POA: Diagnosis not present

## 2024-06-19 LAB — CMP (CANCER CENTER ONLY)
ALT: 22 U/L (ref 0–44)
AST: 21 U/L (ref 15–41)
Albumin: 3.9 g/dL (ref 3.5–5.0)
Alkaline Phosphatase: 85 U/L (ref 38–126)
Anion gap: 8 (ref 5–15)
BUN: 16 mg/dL (ref 6–20)
CO2: 26 mmol/L (ref 22–32)
Calcium: 9.2 mg/dL (ref 8.9–10.3)
Chloride: 103 mmol/L (ref 98–111)
Creatinine: 0.94 mg/dL (ref 0.61–1.24)
GFR, Estimated: >60 mL/min (ref >60)
Glucose, Bld: 120 mg/dL — ABNORMAL HIGH (ref 70–99)
Potassium: 4.4 mmol/L (ref 3.5–5.1)
Sodium: 137 mmol/L (ref 135–145)
Total Bilirubin: 0.2 mg/dL (ref 0.0–1.2)
Total Protein: 6.8 g/dL (ref 6.5–8.1)

## 2024-06-19 LAB — CBC WITH DIFFERENTIAL (CANCER CENTER ONLY)
Abs Immature Granulocytes: 0.02 K/uL (ref 0.00–0.07)
Basophils Absolute: 0.1 K/uL (ref 0.0–0.1)
Basophils Relative: 1 %
Eosinophils Absolute: 0.2 K/uL (ref 0.0–0.5)
Eosinophils Relative: 4 %
HCT: 40.5 % (ref 39.0–52.0)
Hemoglobin: 13.9 g/dL (ref 13.0–17.0)
Immature Granulocytes: 0 %
Lymphocytes Relative: 34 %
Lymphs Abs: 1.6 K/uL (ref 0.7–4.0)
MCH: 30.8 pg (ref 26.0–34.0)
MCHC: 34.3 g/dL (ref 30.0–36.0)
MCV: 89.8 fL (ref 80.0–100.0)
Monocytes Absolute: 0.7 K/uL (ref 0.1–1.0)
Monocytes Relative: 14 %
Neutro Abs: 2.2 K/uL (ref 1.7–7.7)
Neutrophils Relative %: 47 %
Platelet Count: 488 K/uL — ABNORMAL HIGH (ref 150–400)
RBC: 4.51 MIL/uL (ref 4.22–5.81)
RDW: 11.9 % (ref 11.5–15.5)
WBC Count: 4.7 K/uL (ref 4.0–10.5)
nRBC: 0 % (ref 0.0–0.2)

## 2024-06-20 LAB — KAPPA/LAMBDA LIGHT CHAINS
Kappa free light chain: 228 mg/L — ABNORMAL HIGH (ref 3.3–19.4)
Kappa, lambda light chain ratio: 31.67 — ABNORMAL HIGH (ref 0.26–1.65)
Lambda free light chains: 7.2 mg/L (ref 5.7–26.3)

## 2024-06-21 LAB — MULTIPLE MYELOMA PANEL, SERUM
Albumin SerPl Elph-Mcnc: 3.3 g/dL (ref 2.9–4.4)
Albumin/Glob SerPl: 1.1 (ref 0.7–1.7)
Alpha 1: 0.2 g/dL (ref 0.0–0.4)
Alpha2 Glob SerPl Elph-Mcnc: 0.6 g/dL (ref 0.4–1.0)
B-Globulin SerPl Elph-Mcnc: 0.9 g/dL (ref 0.7–1.3)
Gamma Glob SerPl Elph-Mcnc: 1.5 g/dL (ref 0.4–1.8)
Globulin, Total: 3.2 g/dL (ref 2.2–3.9)
IgA: 95 mg/dL (ref 90–386)
IgG (Immunoglobin G), Serum: 1449 mg/dL (ref 603–1613)
IgM (Immunoglobulin M), Srm: 24 mg/dL (ref 20–172)
M Protein SerPl Elph-Mcnc: 1 g/dL — ABNORMAL HIGH
Total Protein ELP: 6.5 g/dL (ref 6.0–8.5)

## 2024-07-01 ENCOUNTER — Ambulatory Visit: Admitting: Oncology

## 2024-07-02 ENCOUNTER — Inpatient Hospital Stay: Admitting: Oncology

## 2024-07-17 ENCOUNTER — Encounter: Payer: Self-pay | Admitting: Oncology

## 2024-07-17 ENCOUNTER — Inpatient Hospital Stay: Admitting: Oncology

## 2024-07-17 VITALS — BP 117/85 | HR 80 | Temp 98.6°F | Resp 20 | Wt 194.0 lb

## 2024-07-17 DIAGNOSIS — D472 Monoclonal gammopathy: Secondary | ICD-10-CM | POA: Diagnosis not present

## 2024-07-17 DIAGNOSIS — D75838 Other thrombocytosis: Secondary | ICD-10-CM

## 2024-07-17 DIAGNOSIS — Z87891 Personal history of nicotine dependence: Secondary | ICD-10-CM

## 2024-07-17 DIAGNOSIS — Z803 Family history of malignant neoplasm of breast: Secondary | ICD-10-CM | POA: Diagnosis not present

## 2024-07-17 NOTE — Progress Notes (Unsigned)
 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 Maree from neurology for evaluation of numbness in his extremities.  As a part of his workup patient underwent SPEP Which incidentally showed IgG kappa M protein of 0.9 g.  Patient has been referred for the same.  Patient admits to being under stress with his day-to-day life but is otherwise very active.  Occasional joint stiffness which is self-limited.  Denies any changes in his appetite or weight.    Results of myeloma workup from 01/16/2024 were as follows: CBC was normal with an H&H of 15.4/44.6. CMP was normal with a creatinine of 0.98. Calcium mildly low at 8.6 with a total protein of 7. Myeloma panel showed 0.8 g of IgG kappa monoclonal protein. Serum free kappa light chain elevated at 205 with a free light chain ratio of 26.6. Random urine protein electrophoresis did not show any evidence of M protein    Bone marrow biopsy on 02/06/2024 showed hypercellular bone marrow involved with kappa restricted plasma cell neoplasm at approximately 15% overall and mild megakaryocytic hyperplasia.  Thrombocytosis could be reactive.  [facility] FISH panel was negative for BCR-ABL on the bone marrow.  FISH for myeloma showed borderline positivity for translocation 11:14.  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 Risk of progression was 26 percent at 2 years, 47 percent at 5 years, and 65 percent at 10 years.    PET CT scan on 02/09/2024 did not show any evidence of lytic lesions from myeloma.  Incidentally he was found to have a bilobed left upper lobe lung lesion measuring 2.1 x 1 cm with an SUV of 4.8.  Patient was seen by Dr. 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  Bone marrow biopsy showed 15% plasma cells and a free light chain ratio greater than 20. His M protein has increased slightly from 0.8 g/dL to 1 g/dL but remains below the high-risk threshold. He has not experienced symptoms such as hypercalcemia, renal insufficiency, or anemia.  He has chronic thrombocytosis, with platelet counts persistently elevated (noted at 456 x10^9/L since at least 2019) without significant upward trend or associated symptoms. He denies new symptoms related to thrombocytosis.  He reports significant psychological distress following his diagnosis, including episodes of severe anxiety and feelings of hopelessness. Over the past six months, he has experienced notable improvement in mood and outlook, describing increased efforts toward healthy living and improved emotional well-being, as corroborated by his scientist, forensic. He currently feels well and is enjoying life.  ECOG PS- 0 Pain scale- 0  Review of systems- Review of Systems  Constitutional:  Negative for chills, fever, malaise/fatigue and weight loss.  HENT:  Negative for  congestion, ear discharge and nosebleeds.   Eyes:  Negative for blurred vision.  Respiratory:  Negative for cough, hemoptysis, sputum production, shortness of breath and wheezing.   Cardiovascular:  Negative for chest pain, palpitations, orthopnea and claudication.  Gastrointestinal:  Negative for abdominal pain, blood in stool, constipation, diarrhea, heartburn, melena, nausea and vomiting.  Genitourinary:  Negative for dysuria, flank pain, frequency, hematuria and urgency.  Musculoskeletal:  Negative for back pain, joint pain and myalgias.  Skin:  Negative for rash.  Neurological:  Negative for dizziness, tingling, focal weakness, seizures, weakness and headaches.  Endo/Heme/Allergies:  Does not bruise/bleed easily.  Psychiatric/Behavioral:  Negative for depression and suicidal ideas. The patient does not have insomnia.       Allergies[1]   Past Medical History:  Diagnosis Date   Achilles tendinitis of left lower extremity 08/23/2022   Allergic rhinitis 03/14/2011   Back pain 12/09/2011   Chest pain 07/25/2023   Chronic insomnia 07/25/2017   Sleep walking on ambien. Trazodone started 1/19     GERD (gastroesophageal reflux disease)    HTN (hypertension) 02/10/2011   Hypercholesteremia    Hyperlipidemia 02/28/2019   Hypertension    Lipoma of abdominal wall    multiple   Low testosterone in male    OSA (obstructive sleep apnea) 09/08/2017   PONV (postoperative nausea and vomiting)    Skin mass 01/20/2017   Sleep apnea    cannot tolerate cpap   Umbilical hernia      Past Surgical History:  Procedure Laterality Date   HERNIA REPAIR Right 2023   IR BONE MARROW BIOPSY & ASPIRATION  02/06/2024   LIPOMA EXCISION     x2   LIPOMA EXCISION N/A 08/26/2022   Procedure: EXCISION LIPOMA;  Surgeon: Rodolph Romano, MD;  Location: [facility] ORS;  Service: General;  Laterality: N/A;   VIDEO BRONCHOSCOPY WITH ENDOBRONCHIAL NAVIGATION Bilateral 02/27/2024   Procedure: VIDEO  BRONCHOSCOPY WITH ENDOBRONCHIAL NAVIGATION;  Surgeon: Isadora Hose, MD;  Location: [facility] ORS;  Service: Pulmonary;  Laterality: Bilateral;    Social History   Socioeconomic History   Marital status: Married    Spouse name: Not on file   Number of children: Not on file   Years of education: Not on file   Highest education level: Not on file  Occupational History   Not on file  Tobacco Use   Smoking status: Former    Current packs/day: 0.00    Types: Cigarettes    Quit date: 2003    Years since quitting: 23.0    Passive exposure: Never   Smokeless tobacco: Never  Vaping Use   Vaping status: Never Used  Substance and Sexual Activity   Alcohol use: Yes    Comment: socially   Drug use: No   Sexual activity: Yes  Other Topics Concern   Not on file  Social History Narrative   Not on file   Social Drivers of Health   Tobacco Use: Medium Risk (07/17/2024)   Patient History    Smoking Tobacco Use: Former    Smokeless Tobacco Use: Never    Passive Exposure: Never  Physicist, Medical Strain: Low Risk  (06/06/2024)   Received from [facility]   Overall Financial Resource Strain (CARDIA)    Difficulty of Paying Living Expenses: Not hard at all  Food Insecurity:  No Food Insecurity (06/06/2024)   Received from [facility]   Epic    Within the past 12 months, you worried that your food would run out before you got the money to buy more.: Never true    Within the past 12 months, the food you bought just didn't last and you didn't have money to get more.: Never true  Transportation Needs: No Transportation Needs (06/06/2024)   Received from [facility] - Transportation    In the past 12 months, has lack of transportation kept you from medical appointments or from getting medications?: No    Lack of Transportation (Non-Medical): No  Physical Activity: Not on file  Stress: Not on file  Social Connections: Not on file   Intimate Partner Violence: Not At Risk (01/16/2024)   Epic    Fear of Current or Ex-Partner: No    Emotionally Abused: No    Physically Abused: No    Sexually Abused: No  Depression (PHQ2-9): Low Risk (03/19/2024)   Depression (PHQ2-9)    PHQ-2 Score: 0  Recent Concern: Depression (PHQ2-9) - High Risk (02/23/2024)   Depression (PHQ2-9)    PHQ-2 Score: 12  Alcohol Screen: Not on file  Housing: Low Risk  (06/06/2024)   Received from [facility]   Epic    In the last 12 months, was there a time when you were not able to pay the mortgage or rent on time?: No    In the past 12 months, how many times have you moved where you were living?: 0    At any time in the past 12 months, were you homeless or living in a shelter (including now)?: No  Utilities: Not At Risk (06/06/2024)   Received from [facility]   Epic    In the past 12 months has the electric, gas, oil, or water company threatened to shut off services in your home?: No  Health Literacy: Adequate Health Literacy (01/25/2024)   B1300 Health Literacy    Frequency of need for help with medical instructions: Never    Family History  Problem Relation Age of Onset   Diabetes Mother    Heart Problems Father    Breast cancer Maternal Grandmother     Current Medications[2]  Physical exam:  Vitals:   07/17/24 1322  BP: 117/85  Pulse: 80  Resp: 20  Temp: 98.6 F (37 C)  SpO2: 100%  Weight: 194 lb (88 kg)   Physical Exam Cardiovascular:     Rate and Rhythm: Normal rate and regular rhythm.     Heart sounds: Normal heart sounds.  Pulmonary:     Effort: Pulmonary effort is normal.     Breath sounds: Normal breath sounds.  Skin:    General: Skin is warm and dry.  Neurological:     Mental Status: He is alert and oriented to person, place, and time.      I have personally reviewed labs listed below:    Latest Ref Rng & Units 06/19/2024    9:06 AM  CMP  Glucose 70 - 99 mg/dL 879   BUN 6  - 20 mg/dL 16   Creatinine 9.38 - 1.24 mg/dL 9.05   Sodium 864 - 854 mmol/L 137   Potassium 3.5 - 5.1 mmol/L 4.4   Chloride 98 - 111 mmol/L 103   CO2 22 - 32 mmol/L 26   Calcium 8.9 - 10.3 mg/dL 9.2   Total  Protein 6.5 - 8.1 g/dL 6.8   Total Bilirubin 0.0 - 1.2 mg/dL 0.2   Alkaline Phos 38 - 126 U/L 85   AST 15 - 41 U/L 21   ALT 0 - 44 U/L 22       Latest Ref Rng & Units 06/19/2024    9:06 AM  CBC  WBC 4.0 - 10.5 K/uL 4.7   Hemoglobin 13.0 - 17.0 g/dL 86.0   Hematocrit 60.9 - 52.0 % 40.5   Platelets 150 - 400 K/uL 488       Assessment and plan- Patient is a 53 y.o. 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 M protein <2 g/dL. No CRAB criteria or progression to overt myeloma.  - Ordered follow-up laboratory studies in three months to monitor M protein and relevant markers. - Scheduled follow-up visit in six months unless laboratory results warrant earlier evaluation. - If M protein exceeds 2 g in the future, he would fall under high risk smoldering multiple myeloma and single agent Darzalex could be considered at that time.  Continue conservative monitoring for now  Chronic thrombocytosis Chronic thrombocytosis with persistently elevated platelet counts. Asymptomatic. Etiology unclear. - Planned additional testing for thrombocytosis at next follow-up in three months including JAK2, CALR and MPL testing        Visit Diagnosis 1. Smoldering multiple myeloma      Dr. Annah Skene, MD, MPH [facility] [phone] 07/17/2024 1:36 PM                    [1] No Known Allergies [2]  Current Outpatient Medications:    amphetamine-dextroamphetamine (ADDERALL XR) 30 MG 24 hr capsule, Take 30 mg by mouth every morning.,  Disp: , Rfl:    escitalopram (LEXAPRO) 10 MG tablet, Take 20 mg by mouth daily., Disp: , Rfl:    lisinopril (ZESTRIL) 20 MG tablet, Take 1 tablet by mouth every morning., Disp: , Rfl:    omeprazole (PRILOSEC OTC) 20 MG tablet, Take 20 mg by mouth as needed., Disp: , Rfl:    ondansetron  (ZOFRAN -ODT) 4 MG disintegrating tablet, Take 1 tablet (4 mg total) by mouth every 6 (six) hours as needed for nausea or vomiting., Disp: 20 tablet, Rfl: 0   rosuvastatin (CRESTOR) 10 MG tablet, Take 1 tablet by mouth every morning., Disp: , Rfl:    ALPRAZolam  (XANAX ) 0.5 MG tablet, Take 1 tablet (0.5 mg total) by mouth every 8 (eight) hours as needed for anxiety. (Patient not taking: Reported on 07/17/2024), Disp: 10 tablet, Rfl: 0   HYDROcodone -acetaminophen  (NORCO/VICODIN) 5-325 MG tablet, Take 2 tablets by mouth every 8 (eight) hours as needed. (Patient not taking: Reported on 07/17/2024), Disp: 14 tablet, Rfl: 0  "

## 2024-10-15 ENCOUNTER — Inpatient Hospital Stay

## 2025-01-14 ENCOUNTER — Inpatient Hospital Stay

## 2025-01-28 ENCOUNTER — Inpatient Hospital Stay: Admitting: Oncology
